# Patient Record
Sex: Male | Born: 1955 | Race: White | Hispanic: No | Marital: Married | Smoking: Never smoker
Health system: Southern US, Community
[De-identification: ages and names within clinical notes are randomized; demographics above are authoritative.]

## PROBLEM LIST (undated history)

## (undated) DIAGNOSIS — I10 Essential (primary) hypertension: Secondary | ICD-10-CM

## (undated) DIAGNOSIS — E785 Hyperlipidemia, unspecified: Secondary | ICD-10-CM

## (undated) HISTORY — PX: APPENDECTOMY: SHX54

---

## 1999-08-28 ENCOUNTER — Ambulatory Visit: Admission: RE | Admit: 1999-08-28 | Discharge: 1999-08-28 | Payer: Self-pay | Admitting: Family Medicine

## 2001-12-25 ENCOUNTER — Ambulatory Visit (HOSPITAL_COMMUNITY): Admission: RE | Admit: 2001-12-25 | Discharge: 2001-12-25 | Payer: Self-pay | Admitting: Gastroenterology

## 2011-10-02 ENCOUNTER — Emergency Department
Admission: EM | Admit: 2011-10-02 | Discharge: 2011-10-02 | Disposition: A | Discharge status: Home / Self Care | Payer: 59 | Source: Home / Self Care

## 2013-10-24 ENCOUNTER — Emergency Department
Admission: EM | Admit: 2013-10-24 | Discharge: 2013-10-24 | Disposition: A | Discharge status: Home / Self Care | Payer: 59 | Source: Home / Self Care | Attending: Emergency Medicine | Admitting: Emergency Medicine

## 2013-10-24 ENCOUNTER — Encounter: Payer: Self-pay | Admitting: Emergency Medicine

## 2013-10-24 DIAGNOSIS — L02519 Cutaneous abscess of unspecified hand: Secondary | ICD-10-CM

## 2013-10-24 DIAGNOSIS — I10 Essential (primary) hypertension: Secondary | ICD-10-CM | POA: Insufficient documentation

## 2013-10-24 DIAGNOSIS — L02512 Cutaneous abscess of left hand: Secondary | ICD-10-CM

## 2013-10-24 DIAGNOSIS — L03019 Cellulitis of unspecified finger: Secondary | ICD-10-CM

## 2013-10-24 DIAGNOSIS — E785 Hyperlipidemia, unspecified: Secondary | ICD-10-CM | POA: Insufficient documentation

## 2013-10-24 HISTORY — DX: Hyperlipidemia, unspecified: E78.5

## 2013-10-24 HISTORY — DX: Essential (primary) hypertension: I10

## 2013-10-24 MED ORDER — DOXYCYCLINE HYCLATE 100 MG PO CAPS: 100.0000 mg | ORAL_CAPSULE | Freq: Two times a day (BID) | ORAL | Status: DC

## 2013-10-24 NOTE — ED Provider Notes (Signed)
CSN: [id number]     Arrival date & time 10/24/13  1639 History   First MD Initiated Contact with Patient 10/24/13 1640     Chief Complaint  Patient presents with  . Wound Infection    x 2 days   HPI Kara complains of redness and swelling on his 2 nd finger on his left hand for 2 days. Denies fever, chills or sweats.  He does not remember any trauma or insect or animal bites. Noted slight pus drainage yesterday. Redness and swelling is worse today. Pain is 2 out of possible 10 .--Has not tried any pain medication.  No fever, chills, coryza, cough, chest pain, dyspnea, nausea, vomiting or any GI symptoms Past Medical History  Diagnosis Date  . Hyperlipidemia   . Hypertension    Past Surgical History  Procedure Laterality Date  . Appendectomy     Family History  Problem Relation Age of Onset  . Cancer Mother     colon  . Heart attack Father    History  Substance Use Topics  . Smoking status: Never Smoker   . Smokeless tobacco: Never Used  . Alcohol Use: No    Review of Systems  All other systems reviewed and are negative.    Allergies  Review of patient's allergies indicates no known allergies.  Home Medications   Current Outpatient Rx  Name  Route  Sig  Dispense  Refill  . aspirin 81 MG tablet   Oral   Take 81 mg by mouth daily.         . calcium carbonate (OS-CAL) 600 MG TABS tablet   Oral   Take 600 mg by mouth 2 (two) times daily with a meal.         . magnesium gluconate (MAGONATE) 500 MG tablet   Oral   Take 500 mg by mouth 2 (two) times daily.         . Omega-3 Fatty Acids (FISH OIL ULTRA PO)   Oral   Take by mouth.         . simvastatin (ZOCOR) 80 MG tablet   Oral   Take 80 mg by mouth daily.         . traZODone (DESYREL) 100 MG tablet   Oral   Take 100 mg by mouth at bedtime.         Marland Kitchen doxycycline (VIBRAMYCIN) 100 MG capsule   Oral   Take 1 capsule (100 mg total) by mouth 2 (two) times daily.   20 capsule   0    BP  138/90  Pulse 86  Temp(Src) 97.7 F (36.5 C) (Oral)  Ht 5\' 8"  (1.727 m)  Wt 235 lb (106.595 kg)  BMI 35.74 kg/m2  SpO2 96% Physical Exam  Nursing note and vitals reviewed. Constitutional: He is oriented to person, place, and time. He appears well-developed and well-nourished. No distress.  HENT:  Head: Normocephalic and atraumatic.  Eyes: Conjunctivae and EOM are normal. Pupils are equal, round, and reactive to light. No scleral icterus.  Neck: Normal range of motion.  Cardiovascular: Normal rate.   Pulmonary/Chest: Effort normal.  Abdominal: He exhibits no distension.  Musculoskeletal: Normal range of motion.       Left hand: He exhibits normal range of motion and normal capillary refill. Normal strength noted.       Hands: Dorsum of left second finger, 1 x 1 cm area of redness, induration, heat, tenderness, with 3 x 3 mm pustule that's fluctuant in the  center.  No bony tenderness. Function normal of left second finger and left hand. No red streaks.  Neurovascular distally intact  Neurological: He is alert and oriented to person, place, and time.  Skin: Skin is warm.  Psychiatric: He has a normal mood and affect.    ED Course  INCISION AND DRAINAGE Date/Time: 10/24/2013 5:25 PM Performed by: Georgina PillionMASSEY, DAVID Authorized by: Georgina PillionMASSEY, DAVID Consent: Verbal consent obtained. Risks and benefits: risks, benefits and alternatives were discussed Consent given by: patient Patient understanding: patient states understanding of the procedure being performed Patient identity confirmed: verbally with patient Time out: Immediately prior to procedure a "time out" was called to verify the correct patient, procedure, equipment, support staff and site/side marked as required. Type: abscess Body area: upper extremity Location details: left index finger Local anesthetic: None--patient declined. Scalpel size: 11 Incision type: single straight Complexity: simple Drainage: purulent and  serosanguinous Drainage amount: scant Wound treatment: wound left open Patient tolerance: Patient tolerated the procedure well with no immediate complications. Comments: Before procedure performed, prepped with alcohol swab. -Sterile technique.  At time of puncture I&D, tiny amount of foul-smelling purulent drainage followed by mild serosanguineous drainage. He tolerated well. Good hemostasis with direct pressure. Bandage applied. Wound care discussed. Culturette from drainage sent for wound culture.   Labs Review Labs Reviewed - No data to display Imaging Review No results found.  EKG Interpretation    Date/Time:    Ventricular Rate:    PR Interval:    QRS Duration:   QT Interval:    QTC Calculation:   R Axis:     Text Interpretation:              MDM   1. Abscess of finger, left    I&D performed as described above Wound culture sent. Doxycycline prescribed Wound care discussed. Precautions discussed. Red flags discussed. Questions invited and answered. Patient voiced understanding and agreement.     Lajean Manesavid Massey, MD 10/24/13 1850

## 2013-10-24 NOTE — ED Notes (Signed)
Jordan Oconnor complains of redness and swelling on his 2 nd finger on his left hand for 2 days. Denies fever, chills or sweats.

## 2013-10-27 ENCOUNTER — Telehealth: Payer: Self-pay | Admitting: *Deleted

## 2013-10-27 LAB — WOUND CULTURE
Gram Stain: NONE SEEN
Gram Stain: NONE SEEN
Gram Stain: NONE SEEN

## 2016-08-05 DIAGNOSIS — K219 Gastro-esophageal reflux disease without esophagitis: Secondary | ICD-10-CM | POA: Diagnosis not present

## 2016-08-05 DIAGNOSIS — E7800 Pure hypercholesterolemia, unspecified: Secondary | ICD-10-CM | POA: Diagnosis not present

## 2016-08-05 DIAGNOSIS — Z23 Encounter for immunization: Secondary | ICD-10-CM | POA: Diagnosis not present

## 2016-08-05 DIAGNOSIS — Z0000 Encounter for general adult medical examination without abnormal findings: Secondary | ICD-10-CM | POA: Diagnosis not present

## 2016-08-05 DIAGNOSIS — I10 Essential (primary) hypertension: Secondary | ICD-10-CM | POA: Diagnosis not present

## 2016-08-05 DIAGNOSIS — G479 Sleep disorder, unspecified: Secondary | ICD-10-CM | POA: Diagnosis not present

## 2016-08-06 ENCOUNTER — Other Ambulatory Visit: Payer: Self-pay | Admitting: Family Medicine

## 2016-08-06 DIAGNOSIS — R946 Abnormal results of thyroid function studies: Secondary | ICD-10-CM

## 2016-08-14 ENCOUNTER — Ambulatory Visit
Admission: RE | Admit: 2016-08-14 | Discharge: 2016-08-14 | Disposition: A | Discharge status: Home / Self Care | Payer: Self-pay | Source: Ambulatory Visit | Attending: Family Medicine | Admitting: Family Medicine

## 2016-08-14 DIAGNOSIS — R946 Abnormal results of thyroid function studies: Secondary | ICD-10-CM

## 2016-08-14 DIAGNOSIS — E041 Nontoxic single thyroid nodule: Secondary | ICD-10-CM | POA: Diagnosis not present

## 2016-08-30 ENCOUNTER — Other Ambulatory Visit: Payer: Self-pay | Admitting: Gastroenterology

## 2016-11-08 ENCOUNTER — Encounter (HOSPITAL_COMMUNITY): Payer: Self-pay

## 2016-11-11 ENCOUNTER — Ambulatory Visit (HOSPITAL_COMMUNITY)
Admission: RE | Admit: 2016-11-11 | Discharge: 2016-11-11 | Disposition: A | Discharge status: Home / Self Care | Payer: BLUE CROSS/BLUE SHIELD | Source: Ambulatory Visit | Attending: Gastroenterology | Admitting: Gastroenterology

## 2016-11-11 ENCOUNTER — Encounter (HOSPITAL_COMMUNITY)
Admission: RE | Disposition: A | Discharge status: Home / Self Care | Payer: Self-pay | Source: Ambulatory Visit | Attending: Gastroenterology

## 2016-11-11 ENCOUNTER — Encounter (HOSPITAL_COMMUNITY): Payer: Self-pay | Admitting: *Deleted

## 2016-11-11 ENCOUNTER — Ambulatory Visit (HOSPITAL_COMMUNITY): Payer: BLUE CROSS/BLUE SHIELD | Admitting: Certified Registered Nurse Anesthetist

## 2016-11-11 DIAGNOSIS — G4733 Obstructive sleep apnea (adult) (pediatric): Secondary | ICD-10-CM | POA: Diagnosis not present

## 2016-11-11 DIAGNOSIS — I10 Essential (primary) hypertension: Secondary | ICD-10-CM | POA: Diagnosis not present

## 2016-11-11 DIAGNOSIS — Z1211 Encounter for screening for malignant neoplasm of colon: Secondary | ICD-10-CM | POA: Diagnosis not present

## 2016-11-11 DIAGNOSIS — K5730 Diverticulosis of large intestine without perforation or abscess without bleeding: Secondary | ICD-10-CM | POA: Diagnosis not present

## 2016-11-11 DIAGNOSIS — E7800 Pure hypercholesterolemia, unspecified: Secondary | ICD-10-CM | POA: Insufficient documentation

## 2016-11-11 DIAGNOSIS — Z86010 Personal history of colonic polyps: Secondary | ICD-10-CM | POA: Insufficient documentation

## 2016-11-11 DIAGNOSIS — K219 Gastro-esophageal reflux disease without esophagitis: Secondary | ICD-10-CM | POA: Insufficient documentation

## 2016-11-11 DIAGNOSIS — E785 Hyperlipidemia, unspecified: Secondary | ICD-10-CM | POA: Diagnosis not present

## 2016-11-11 HISTORY — PX: COLONOSCOPY WITH PROPOFOL: SHX5780

## 2016-11-11 SURGERY — COLONOSCOPY WITH PROPOFOL
Anesthesia: Monitor Anesthesia Care

## 2016-11-11 MED ORDER — PROPOFOL 500 MG/50ML IV EMUL
INTRAVENOUS | Status: DC | PRN
  Administered 2016-11-11: 100 ug/kg/min via INTRAVENOUS

## 2016-11-11 MED ORDER — PROPOFOL 10 MG/ML IV BOLUS
INTRAVENOUS | Status: AC
  Filled 2016-11-11: qty 20

## 2016-11-11 MED ORDER — PROPOFOL 10 MG/ML IV BOLUS
INTRAVENOUS | Status: AC
  Filled 2016-11-11: qty 40

## 2016-11-11 MED ORDER — SODIUM CHLORIDE 0.9 % IV SOLN: INTRAVENOUS | Status: DC

## 2016-11-11 MED ORDER — LACTATED RINGERS IV SOLN
INTRAVENOUS | Status: DC
  Administered 2016-11-11: 1000 mL via INTRAVENOUS

## 2016-11-11 MED ORDER — LIDOCAINE 2% (20 MG/ML) 5 ML SYRINGE
INTRAMUSCULAR | Status: DC | PRN
  Administered 2016-11-11: 100 mg via INTRAVENOUS

## 2016-11-11 MED ORDER — LIDOCAINE 2% (20 MG/ML) 5 ML SYRINGE
INTRAMUSCULAR | Status: AC
  Filled 2016-11-11: qty 5

## 2016-11-11 MED ORDER — PROPOFOL 10 MG/ML IV BOLUS
INTRAVENOUS | Status: DC | PRN
  Administered 2016-11-11 (×3): 20 mg via INTRAVENOUS
  Administered 2016-11-11: 50 mg via INTRAVENOUS

## 2016-11-11 SURGICAL SUPPLY — 21 items

## 2016-11-11 NOTE — Anesthesia Preprocedure Evaluation (Addendum)
Anesthesia Evaluation  Patient identified by MRN, date of birth, ID band Patient awake    Reviewed: Allergy & Precautions, NPO status   Airway Mallampati: II  TM Distance: >3 FB Neck ROM: Full    Dental  (+) Teeth Intact   Pulmonary neg pulmonary ROS,    breath sounds clear to auscultation       Cardiovascular hypertension,  Rhythm:Regular Rate:Normal     Neuro/Psych negative neurological ROS     GI/Hepatic negative GI ROS, Neg liver ROS,   Endo/Other  negative endocrine ROS  Renal/GU negative Renal ROS     Musculoskeletal negative musculoskeletal ROS (+)   Abdominal   Peds  Hematology negative hematology ROS (+)   Anesthesia Other Findings   Reproductive/Obstetrics                            Anesthesia Physical Anesthesia Plan  ASA: II  Anesthesia Plan: MAC   Post-op Pain Management:    Induction: Intravenous  Airway Management Planned: Natural Airway and Nasal Cannula  Additional Equipment:   Intra-op Plan:   Post-operative Plan:   Informed Consent: I have reviewed the patients History and Physical, chart, labs and discussed the procedure including the risks, benefits and alternatives for the proposed anesthesia with the patient or authorized representative who has indicated his/her understanding and acceptance.   Dental advisory given  Plan Discussed with:   Anesthesia Plan Comments:         Anesthesia Quick Evaluation

## 2016-11-11 NOTE — H&P (Signed)
Procedure: Surveillance colonoscopy. 01/29/2011 colonoscopy was performed with removal of a 4 mm descending colon tubular adenomatous polyp  History: The patient is a 61 year old male born 07/27/1956. He is scheduled to undergo a surveillance colonoscopy today.  Past medical history: Appendectomy. Hypertension. Obstructive sleep apnea. Gastroesophageal reflux. Hypercholesterolemia.  Exam: The patient is alert and lying comfortably on the endoscopy stretcher. Abdomen is soft and nontender to palpation. Lungs are clear to auscultation. Cardiac exam reveals a regular rhythm.  Plan: Proceed with surveillance colonoscopy

## 2016-11-11 NOTE — Anesthesia Postprocedure Evaluation (Addendum)
Anesthesia Post Note  Patient: Jordan Oconnor  Procedure(s) Performed: Procedure(s) (LRB): COLONOSCOPY WITH PROPOFOL (N/A)  Patient location during evaluation: Endoscopy Anesthesia Type: MAC Level of consciousness: awake and alert Pain management: pain level controlled Vital Signs Assessment: post-procedure vital signs reviewed and stable Respiratory status: spontaneous breathing, nonlabored ventilation, respiratory function stable and patient connected to nasal cannula oxygen Cardiovascular status: stable and blood pressure returned to baseline Anesthetic complications: no       Last Vitals:  Vitals:   11/11/16 1013 11/11/16 1150  BP: (!) 154/98 124/71  Pulse: 81 89  Resp: 14 15  Temp: 36.7 C 36.5 C    Last Pain:  Vitals:   11/11/16 1150  TempSrc: Oral                 Ladarrion Telfair,JAMES TERRILL

## 2016-11-11 NOTE — Op Note (Signed)
[facility] Patient Name: Jordan Oconnor Feely Procedure Date: 11/11/2016 MRN: [id number] Attending MD: Charolett BumpersMartin K Lissandro Dilorenzo , MD Date of Birth: 08/23/1956 CSN: [id number] Age: 61 Admit Type: Outpatient Procedure:                Colonoscopy Indications:              High risk colon cancer surveillance: Personal                            history of adenoma less than 10 mm in size Providers:                Charolett BumpersMartin K. Marciana Uplinger, MD, Omelia BlackwaterShelby Carpenter RN, RN,                            Darletta MollJackie Aiken Tech, Technician, Randon Goldsmithachel Jones, CRNA Referring MD:              Medicines:                Propofol per Anesthesia Complications:            No immediate complications. Estimated Blood Loss:     Estimated blood loss: none. Procedure:                Pre-Anesthesia Assessment:                           - Prior to the procedure, a History and Physical                            was performed, and patient medications and                            allergies were reviewed. The patient's tolerance of                            previous anesthesia was also reviewed. The risks                            and benefits of the procedure and the sedation                            options and risks were discussed with the patient.                            All questions were answered, and informed consent                            was obtained. Prior Anticoagulants: The patient has                            taken aspirin, last dose was day of procedure. ASA                            Grade Assessment: II - A patient with mild systemic  disease. After reviewing the risks and benefits,                            the patient was deemed in satisfactory condition to                            undergo the procedure.                           After obtaining informed consent, the colonoscope                            was passed under direct vision. Throughout the        procedure, the patient's blood pressure, pulse, and                            oxygen saturations were monitored continuously. The                            EC-[id number] ([id number]) scope was introduced through                            the anus and advanced to the the cecum, identified                            by appendiceal orifice and ileocecal valve. The                            colonoscopy was performed without difficulty. The                            patient tolerated the procedure well. The quality                            of the bowel preparation was good. The appendiceal                            orifice and the rectum were photographed. Scope In: 11:31:24 AM Scope Out: 11:43:52 AM Scope Withdrawal Time: 0 hours 8 minutes 2 seconds  Total Procedure Duration: 0 hours 12 minutes 28 seconds  Findings:      The perianal and digital rectal examinations were normal.      The entire examined colon appeared normal. Left colonic diverticulosis       was present. Impression:               - The entire examined colon is normal.                           - No specimens collected. Moderate Sedation:      N/A- Per Anesthesia Care Recommendation:           - Patient has a contact number available for                            emergencies. The signs and  symptoms of potential                            delayed complications were discussed with the                            patient. Return to normal activities tomorrow.                            Written discharge instructions were provided to the                            patient.                           - Repeat colonoscopy in 5 years for surveillance.                           - Resume previous diet.                           - Continue present medications. Procedure Code(s):        --- Professional ---                           Z6109, Colorectal cancer screening; colonoscopy on                            individual at high  risk Diagnosis Code(s):        --- Professional ---                           Z86.010, Personal history of colonic polyps CPT copyright 2016 [employer]. All rights reserved. The codes documented in this report are preliminary and upon coder review may  be revised to meet current compliance requirements. Danise Edge, MD Charolett Bumpers, MD 11/11/2016 11:51:28 AM This report has been signed electronically. Number of Addenda: 0

## 2016-11-11 NOTE — Discharge Instructions (Signed)

## 2016-11-11 NOTE — Transfer of Care (Signed)
Immediate Anesthesia Transfer of Care Note  Patient: Jordan Oconnor  Procedure(s) Performed: Procedure(s): COLONOSCOPY WITH PROPOFOL (N/A)  Patient Location: PACU  Anesthesia Type:MAC  Level of Consciousness: Patient easily awoken, sedated, comfortable, cooperative, following commands, responds to stimulation.   Airway & Oxygen Therapy: Patient spontaneously breathing, ventilating well, oxygen via simple oxygen mask.  Post-op Assessment: Report given to PACU RN, vital signs reviewed and stable, moving all extremities.   Post vital signs: Reviewed and stable.  Complications: No apparent anesthesia complications  Last Vitals:  Vitals:   11/11/16 1013 11/11/16 1150  BP: (!) 154/98 124/71  Pulse: 81 89  Resp: 14 15  Temp: 36.7 C 36.5 C    Last Pain:  Vitals:   11/11/16 1150  TempSrc: Oral         Complications: No apparent anesthesia complications

## 2016-11-12 ENCOUNTER — Encounter (HOSPITAL_COMMUNITY): Payer: Self-pay | Admitting: Gastroenterology

## 2017-02-03 DIAGNOSIS — G4733 Obstructive sleep apnea (adult) (pediatric): Secondary | ICD-10-CM | POA: Diagnosis not present

## 2017-03-11 DIAGNOSIS — L570 Actinic keratosis: Secondary | ICD-10-CM | POA: Diagnosis not present

## 2017-03-11 DIAGNOSIS — L910 Hypertrophic scar: Secondary | ICD-10-CM | POA: Diagnosis not present

## 2017-03-14 NOTE — Addendum Note (Signed)
Addendum  created 03/14/17 1012 by Hatice Bubel, Liborio, MD   Sign clinical note    

## 2017-05-15 DIAGNOSIS — G4733 Obstructive sleep apnea (adult) (pediatric): Secondary | ICD-10-CM | POA: Diagnosis not present

## 2017-08-29 DIAGNOSIS — G4733 Obstructive sleep apnea (adult) (pediatric): Secondary | ICD-10-CM | POA: Diagnosis not present

## 2017-09-08 DIAGNOSIS — E7800 Pure hypercholesterolemia, unspecified: Secondary | ICD-10-CM | POA: Diagnosis not present

## 2017-09-08 DIAGNOSIS — I10 Essential (primary) hypertension: Secondary | ICD-10-CM | POA: Diagnosis not present

## 2017-09-08 DIAGNOSIS — Z23 Encounter for immunization: Secondary | ICD-10-CM | POA: Diagnosis not present

## 2017-09-08 DIAGNOSIS — Z0000 Encounter for general adult medical examination without abnormal findings: Secondary | ICD-10-CM | POA: Diagnosis not present

## 2017-09-08 DIAGNOSIS — G479 Sleep disorder, unspecified: Secondary | ICD-10-CM | POA: Diagnosis not present

## 2017-09-08 DIAGNOSIS — E041 Nontoxic single thyroid nodule: Secondary | ICD-10-CM | POA: Diagnosis not present

## 2017-09-12 ENCOUNTER — Other Ambulatory Visit: Payer: Self-pay | Admitting: Family Medicine

## 2017-09-12 DIAGNOSIS — E041 Nontoxic single thyroid nodule: Secondary | ICD-10-CM

## 2017-09-19 ENCOUNTER — Ambulatory Visit
Admission: RE | Admit: 2017-09-19 | Discharge: 2017-09-19 | Disposition: A | Discharge status: Home / Self Care | Payer: BLUE CROSS/BLUE SHIELD | Source: Ambulatory Visit | Attending: Family Medicine | Admitting: Family Medicine

## 2017-09-19 DIAGNOSIS — E041 Nontoxic single thyroid nodule: Secondary | ICD-10-CM

## 2017-09-19 DIAGNOSIS — E042 Nontoxic multinodular goiter: Secondary | ICD-10-CM | POA: Diagnosis not present

## 2017-09-24 ENCOUNTER — Other Ambulatory Visit: Payer: Self-pay | Admitting: Family Medicine

## 2017-09-24 DIAGNOSIS — E041 Nontoxic single thyroid nodule: Secondary | ICD-10-CM

## 2017-09-30 ENCOUNTER — Other Ambulatory Visit (HOSPITAL_COMMUNITY)
Admission: RE | Admit: 2017-09-30 | Discharge: 2017-09-30 | Disposition: A | Discharge status: Home / Self Care | Payer: BLUE CROSS/BLUE SHIELD | Source: Ambulatory Visit | Attending: Radiology | Admitting: Radiology

## 2017-09-30 ENCOUNTER — Ambulatory Visit
Admission: RE | Admit: 2017-09-30 | Discharge: 2017-09-30 | Disposition: A | Discharge status: Home / Self Care | Payer: BLUE CROSS/BLUE SHIELD | Source: Ambulatory Visit | Attending: Family Medicine | Admitting: Family Medicine

## 2017-09-30 DIAGNOSIS — E041 Nontoxic single thyroid nodule: Secondary | ICD-10-CM | POA: Insufficient documentation

## 2017-11-18 DIAGNOSIS — D225 Melanocytic nevi of trunk: Secondary | ICD-10-CM | POA: Diagnosis not present

## 2017-11-18 DIAGNOSIS — L910 Hypertrophic scar: Secondary | ICD-10-CM | POA: Diagnosis not present

## 2017-11-18 DIAGNOSIS — L821 Other seborrheic keratosis: Secondary | ICD-10-CM | POA: Diagnosis not present

## 2017-11-18 DIAGNOSIS — L814 Other melanin hyperpigmentation: Secondary | ICD-10-CM | POA: Diagnosis not present

## 2017-11-18 DIAGNOSIS — D1801 Hemangioma of skin and subcutaneous tissue: Secondary | ICD-10-CM | POA: Diagnosis not present

## 2018-05-17 IMAGING — US US THYROID
1 series · 13 of 25 positions shown · non-contrast
Comparison: None.

CLINICAL DATA: PALPABLE THYROID NODULE ON EXAM

EXAM:
THYROID ULTRASOUND
TECHNIQUE: Ultrasound examination of the thyroid gland and adjacent soft
tissues was performed.

[Series 1: us thyroid · 0.08mm/px · 13 of 45 slices shown]
[im 1/45]
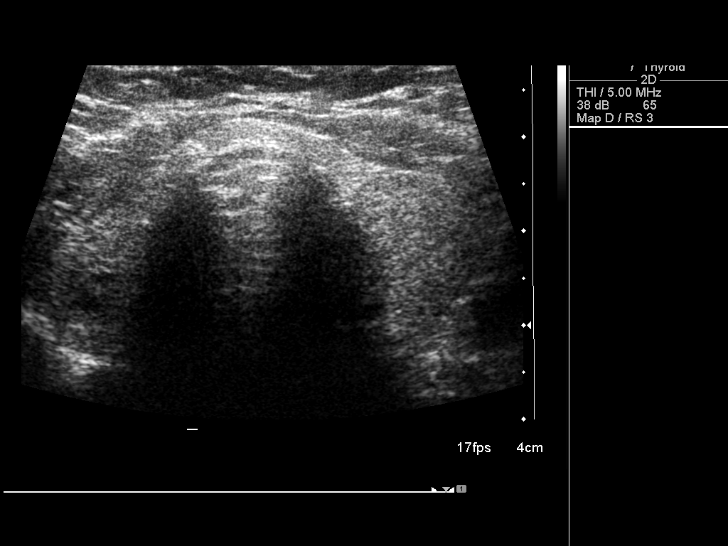
[im 4/45]
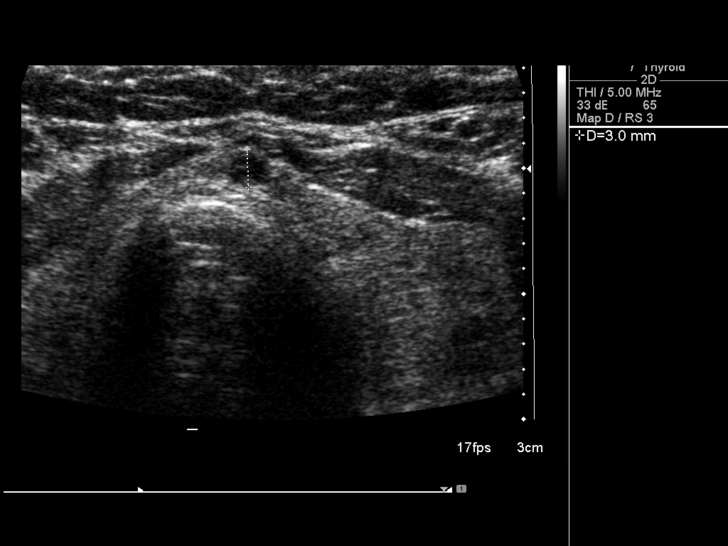
[im 8/45]
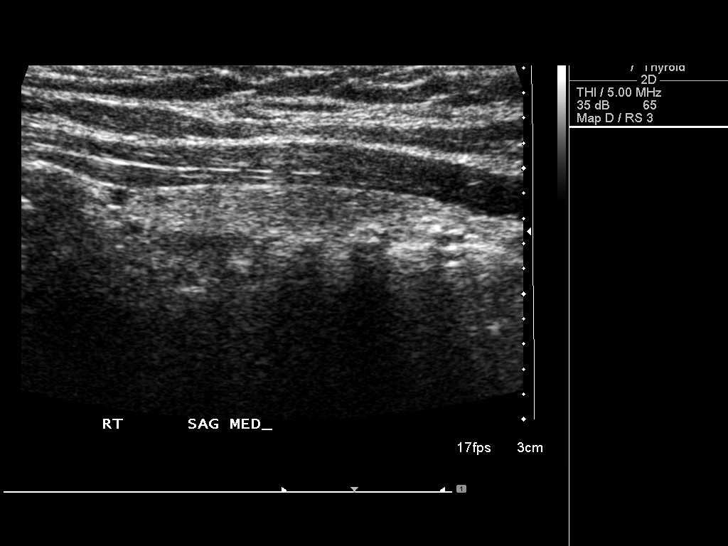
[im 12/45]
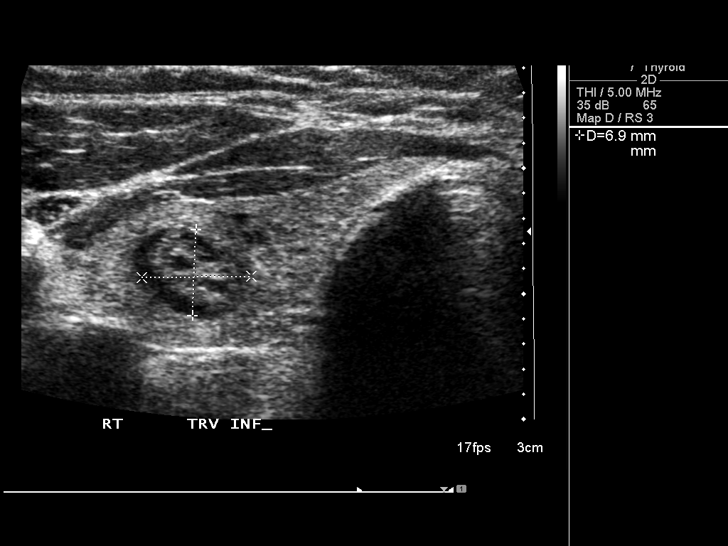
[im 15/45]
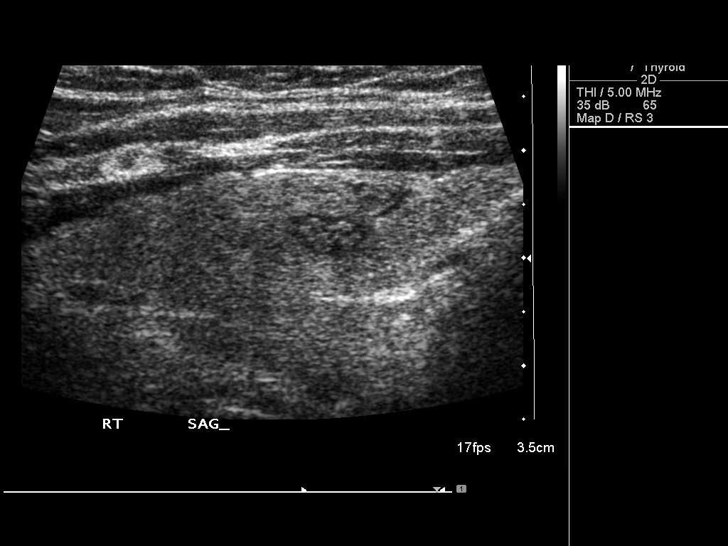
[im 19/45]
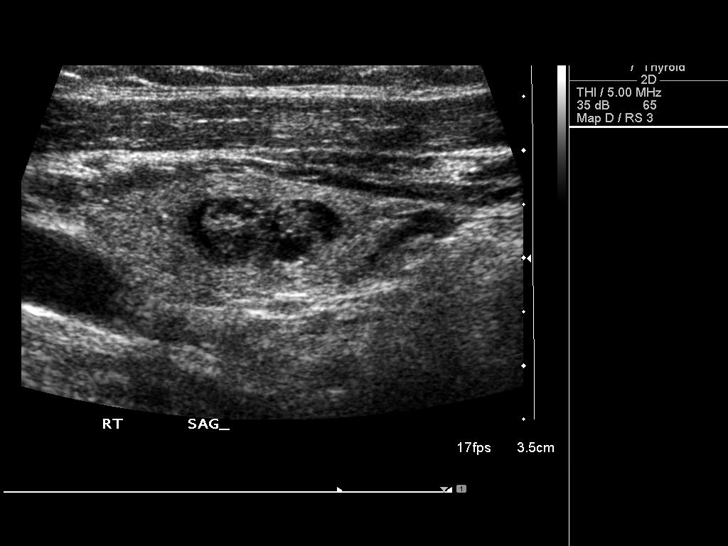
[im 23/45]
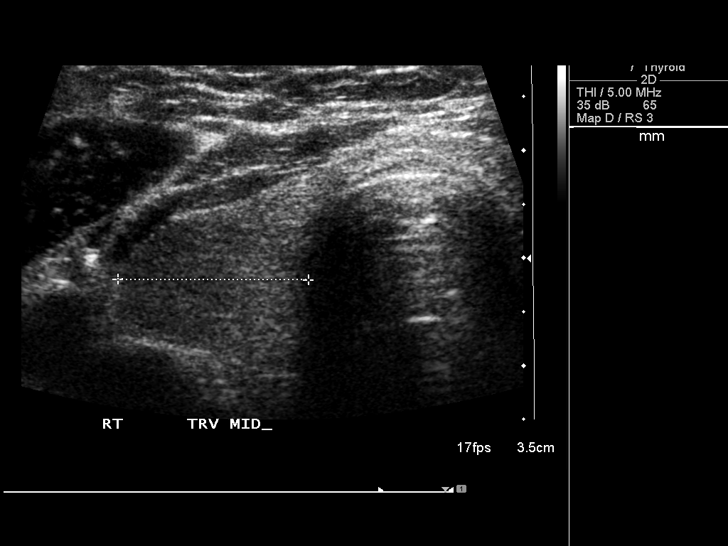
[im 26/45]
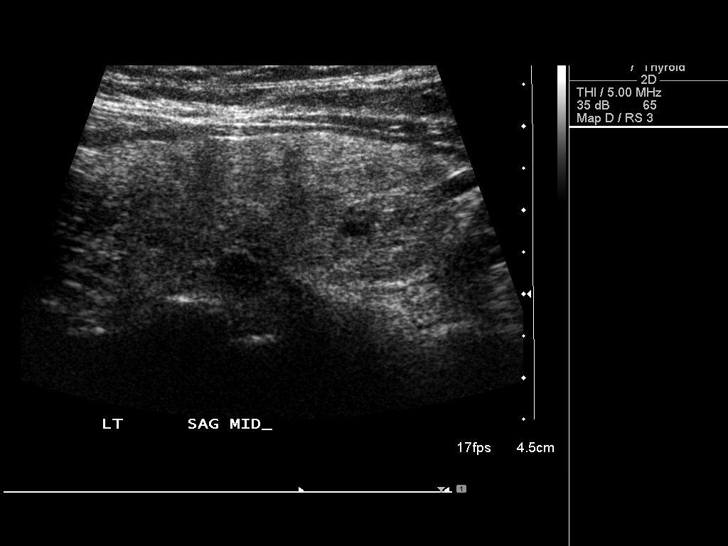
[im 30/45]
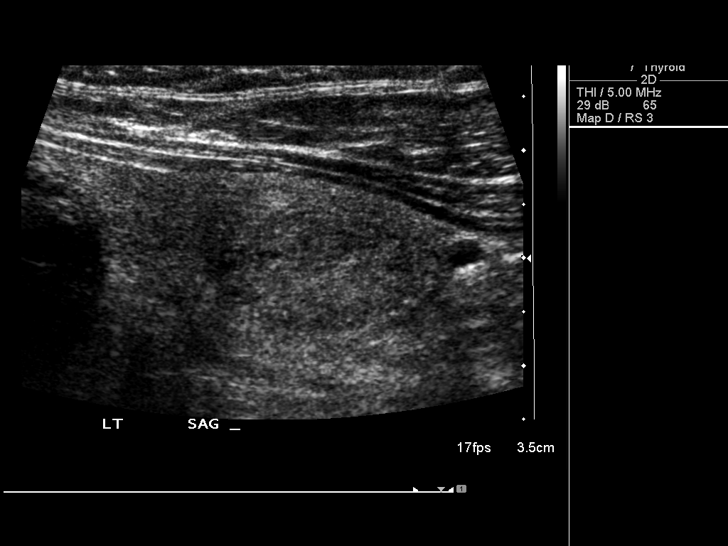
[im 34/45]
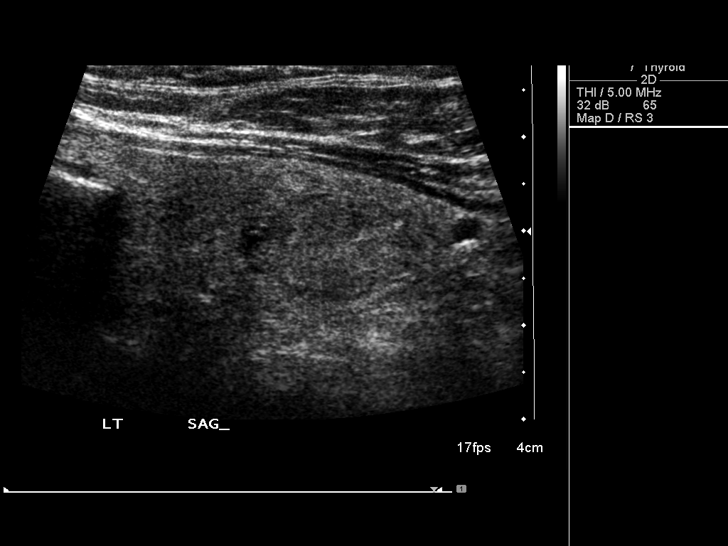
[im 37/45]
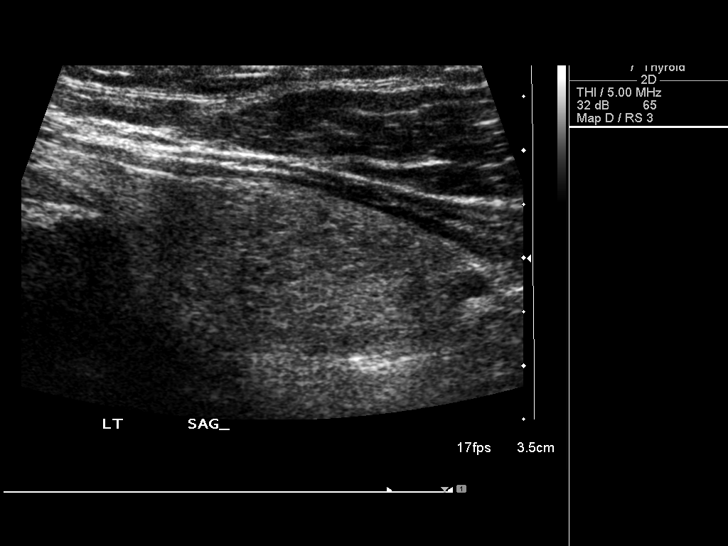
[im 41/45]
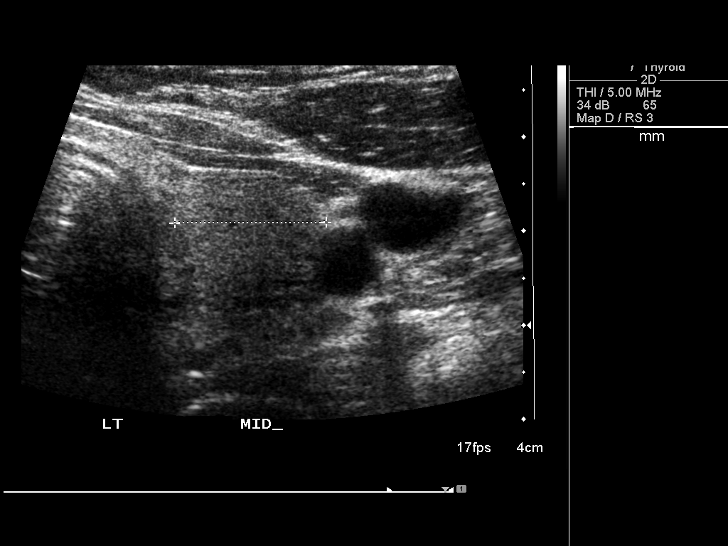
[im 45/45]
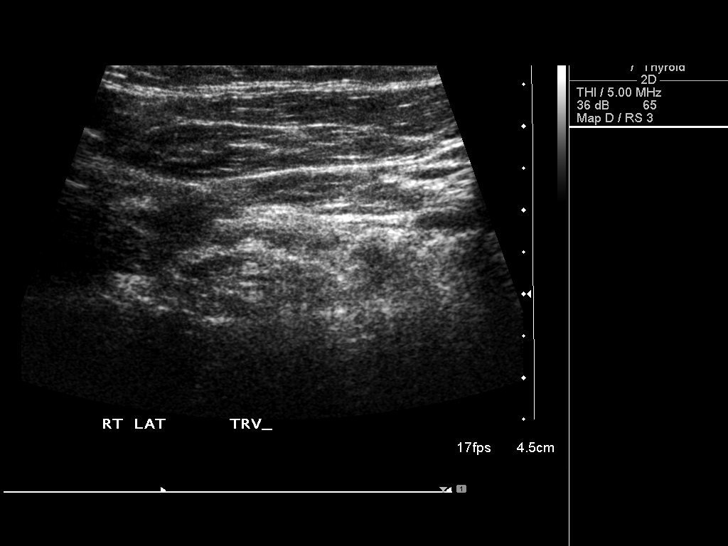

[13 of 25 positions shown; findings below may reference images not displayed]

FINDINGS: Parenchymal Echotexture: Mildly heterogenous

Estimated total number of nodules >/= 1 cm: 2

Number of spongiform nodules >/=  2 cm not described below (TR1): 0

Number of mixed cystic and solid nodules >/= 1.5 cm not described
below (TR2): 0

_________________________________________________________

Isthmus: 3 mm

Incidental tiny 3 mm cystic nodule in the isthmus noted.

_________________________________________________________

Right lobe: 4.5 x 1.5 x 1.8 cm

Nodule # 1:

Location: Right; Inferior

Size: 10 x 7 x 9 mm

Composition: mixed cystic and solid (1)

Echogenicity: isoechoic (1)

Shape: not taller-than-wide (0)

Margins: ill-defined (0)

Echogenic foci: none (0)

ACR TI-RADS total points: 2.

ACR TI-RADS risk category: TR2 (2 points).

ACR TI-RADS recommendations:

This nodule does NOT meet TI-RADS criteria for biopsy or dedicated
follow-up.

Nodule # 2:

Location: Right; Inferior

Size: 8 x 5 x 7 mm

Composition: mixed cystic and solid (1)

Echogenicity: isoechoic (1)

Shape: not taller-than-wide (0)

Margins: ill-defined (0)

Echogenic foci: none (0)

ACR TI-RADS total points: 2.

ACR TI-RADS risk category: TR2 (2 points).

ACR TI-RADS recommendations:

This nodule does NOT meet TI-RADS criteria for biopsy or dedicated
follow-up.

_________________________________________________________

Left lobe: 4.5 x 1.8 x 1.6 cm

Nodule # 3:

Location: Left; Inferior

Size: 1.6 x 1.1 x 1.6 cm

Composition: solid/almost completely solid (2)

Echogenicity: isoechoic (1)

Shape: not taller-than-wide (0)

Margins: ill-defined (0)

Echogenic foci: none (0)

ACR TI-RADS total points: 3.

ACR TI-RADS risk category: TR3 (3 points).

ACR TI-RADS recommendations:

*Given size (>/= 1.5 - 2.4 cm) and appearance, a follow-up
ultrasound in 1 year should be considered based on TI-RADS criteria.
IMPRESSION: 1.6 cm left inferior isoechoic solid nodule (TR 3) meets criteria
for follow-up in 1 year.

Right inferior pole mixed cystic solid nodules (TR 2) do not meet
criteria for biopsy or additional follow-up.

The above is in keeping with the ACR TI-RADS recommendations - [HOSPITAL] 1732;[DATE].

## 2018-06-01 DIAGNOSIS — L0390 Cellulitis, unspecified: Secondary | ICD-10-CM | POA: Diagnosis not present

## 2018-06-01 DIAGNOSIS — Z6837 Body mass index (BMI) 37.0-37.9, adult: Secondary | ICD-10-CM | POA: Diagnosis not present

## 2018-08-05 DIAGNOSIS — G4733 Obstructive sleep apnea (adult) (pediatric): Secondary | ICD-10-CM | POA: Diagnosis not present

## 2018-08-14 HISTORY — PX: NEPHRECTOMY: SHX65

## 2018-08-18 DIAGNOSIS — Z7982 Long term (current) use of aspirin: Secondary | ICD-10-CM | POA: Diagnosis not present

## 2018-08-18 DIAGNOSIS — N3289 Other specified disorders of bladder: Secondary | ICD-10-CM | POA: Diagnosis not present

## 2018-08-18 DIAGNOSIS — K8689 Other specified diseases of pancreas: Secondary | ICD-10-CM | POA: Diagnosis not present

## 2018-08-18 DIAGNOSIS — I10 Essential (primary) hypertension: Secondary | ICD-10-CM | POA: Diagnosis not present

## 2018-08-18 DIAGNOSIS — N2889 Other specified disorders of kidney and ureter: Secondary | ICD-10-CM | POA: Diagnosis not present

## 2018-08-18 DIAGNOSIS — R1084 Generalized abdominal pain: Secondary | ICD-10-CM | POA: Diagnosis not present

## 2018-08-18 DIAGNOSIS — R160 Hepatomegaly, not elsewhere classified: Secondary | ICD-10-CM | POA: Diagnosis not present

## 2018-08-18 DIAGNOSIS — R319 Hematuria, unspecified: Secondary | ICD-10-CM | POA: Diagnosis not present

## 2018-08-18 DIAGNOSIS — Z79899 Other long term (current) drug therapy: Secondary | ICD-10-CM | POA: Diagnosis not present

## 2018-08-20 DIAGNOSIS — N23 Unspecified renal colic: Secondary | ICD-10-CM | POA: Diagnosis not present

## 2018-08-20 DIAGNOSIS — C649 Malignant neoplasm of unspecified kidney, except renal pelvis: Secondary | ICD-10-CM | POA: Diagnosis not present

## 2018-08-21 DIAGNOSIS — N200 Calculus of kidney: Secondary | ICD-10-CM | POA: Diagnosis not present

## 2018-08-21 DIAGNOSIS — R319 Hematuria, unspecified: Secondary | ICD-10-CM | POA: Diagnosis not present

## 2018-08-21 DIAGNOSIS — K4090 Unilateral inguinal hernia, without obstruction or gangrene, not specified as recurrent: Secondary | ICD-10-CM | POA: Diagnosis not present

## 2018-08-21 DIAGNOSIS — R160 Hepatomegaly, not elsewhere classified: Secondary | ICD-10-CM | POA: Diagnosis not present

## 2018-08-21 DIAGNOSIS — K5730 Diverticulosis of large intestine without perforation or abscess without bleeding: Secondary | ICD-10-CM | POA: Diagnosis not present

## 2018-08-21 DIAGNOSIS — N2889 Other specified disorders of kidney and ureter: Secondary | ICD-10-CM | POA: Diagnosis not present

## 2018-08-26 DIAGNOSIS — I10 Essential (primary) hypertension: Secondary | ICD-10-CM | POA: Diagnosis not present

## 2018-08-26 DIAGNOSIS — E785 Hyperlipidemia, unspecified: Secondary | ICD-10-CM | POA: Diagnosis not present

## 2018-08-26 DIAGNOSIS — N2889 Other specified disorders of kidney and ureter: Secondary | ICD-10-CM | POA: Diagnosis not present

## 2018-08-26 DIAGNOSIS — Z01818 Encounter for other preprocedural examination: Secondary | ICD-10-CM | POA: Diagnosis not present

## 2018-08-26 DIAGNOSIS — G4733 Obstructive sleep apnea (adult) (pediatric): Secondary | ICD-10-CM | POA: Diagnosis not present

## 2018-08-28 DIAGNOSIS — I10 Essential (primary) hypertension: Secondary | ICD-10-CM | POA: Diagnosis not present

## 2018-08-28 DIAGNOSIS — Z6834 Body mass index (BMI) 34.0-34.9, adult: Secondary | ICD-10-CM | POA: Diagnosis not present

## 2018-08-28 DIAGNOSIS — N2889 Other specified disorders of kidney and ureter: Secondary | ICD-10-CM | POA: Diagnosis not present

## 2018-08-28 DIAGNOSIS — E785 Hyperlipidemia, unspecified: Secondary | ICD-10-CM | POA: Diagnosis not present

## 2018-08-28 DIAGNOSIS — K567 Ileus, unspecified: Secondary | ICD-10-CM | POA: Diagnosis not present

## 2018-08-28 DIAGNOSIS — E669 Obesity, unspecified: Secondary | ICD-10-CM | POA: Diagnosis not present

## 2018-08-28 DIAGNOSIS — R109 Unspecified abdominal pain: Secondary | ICD-10-CM | POA: Diagnosis not present

## 2018-08-28 DIAGNOSIS — C641 Malignant neoplasm of right kidney, except renal pelvis: Secondary | ICD-10-CM | POA: Diagnosis not present

## 2018-08-28 DIAGNOSIS — K219 Gastro-esophageal reflux disease without esophagitis: Secondary | ICD-10-CM | POA: Diagnosis not present

## 2018-08-28 DIAGNOSIS — Z9989 Dependence on other enabling machines and devices: Secondary | ICD-10-CM | POA: Diagnosis not present

## 2018-08-28 DIAGNOSIS — G4733 Obstructive sleep apnea (adult) (pediatric): Secondary | ICD-10-CM | POA: Diagnosis not present

## 2018-09-01 DIAGNOSIS — R109 Unspecified abdominal pain: Secondary | ICD-10-CM | POA: Diagnosis not present

## 2018-09-02 DIAGNOSIS — G4733 Obstructive sleep apnea (adult) (pediatric): Secondary | ICD-10-CM | POA: Diagnosis not present

## 2018-09-08 DIAGNOSIS — C641 Malignant neoplasm of right kidney, except renal pelvis: Secondary | ICD-10-CM | POA: Diagnosis not present

## 2018-09-08 DIAGNOSIS — Z905 Acquired absence of kidney: Secondary | ICD-10-CM | POA: Diagnosis not present

## 2018-09-15 DIAGNOSIS — I10 Essential (primary) hypertension: Secondary | ICD-10-CM | POA: Diagnosis not present

## 2018-09-28 DIAGNOSIS — C649 Malignant neoplasm of unspecified kidney, except renal pelvis: Secondary | ICD-10-CM | POA: Diagnosis not present

## 2018-09-28 DIAGNOSIS — R948 Abnormal results of function studies of other organs and systems: Secondary | ICD-10-CM | POA: Diagnosis not present

## 2018-09-28 DIAGNOSIS — M170 Bilateral primary osteoarthritis of knee: Secondary | ICD-10-CM | POA: Diagnosis not present

## 2018-09-28 DIAGNOSIS — R918 Other nonspecific abnormal finding of lung field: Secondary | ICD-10-CM | POA: Diagnosis not present

## 2018-09-28 DIAGNOSIS — C641 Malignant neoplasm of right kidney, except renal pelvis: Secondary | ICD-10-CM | POA: Diagnosis not present

## 2018-09-28 DIAGNOSIS — J984 Other disorders of lung: Secondary | ICD-10-CM | POA: Diagnosis not present

## 2018-10-02 DIAGNOSIS — C641 Malignant neoplasm of right kidney, except renal pelvis: Secondary | ICD-10-CM | POA: Diagnosis not present

## 2018-10-14 IMAGING — US US THYROID
1 series · 13 of 25 positions shown · non-contrast
Comparison: 08/14/2016

CLINICAL DATA: 69-year-old male with a history of thyroid nodules

EXAM:
THYROID ULTRASOUND
TECHNIQUE: Ultrasound examination of the thyroid gland and adjacent soft
tissues was performed.

[Series 1: us thyroid · 0.04mm/px · 13 of 78 slices shown]
[im 1/78]
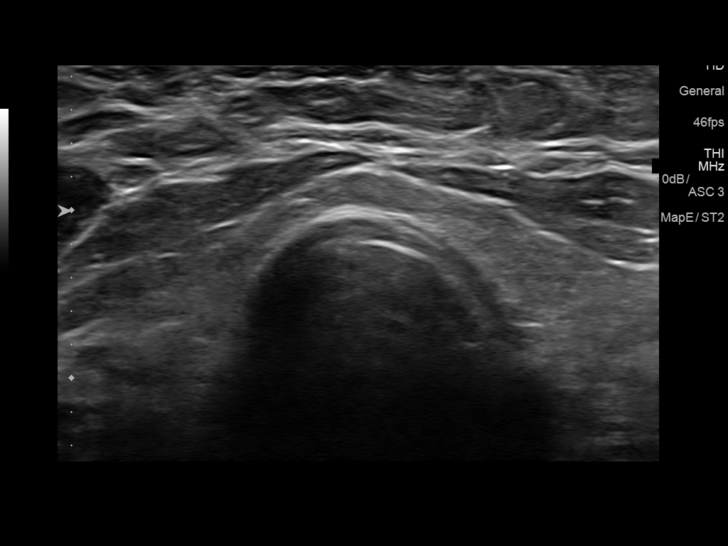
[im 7/78]
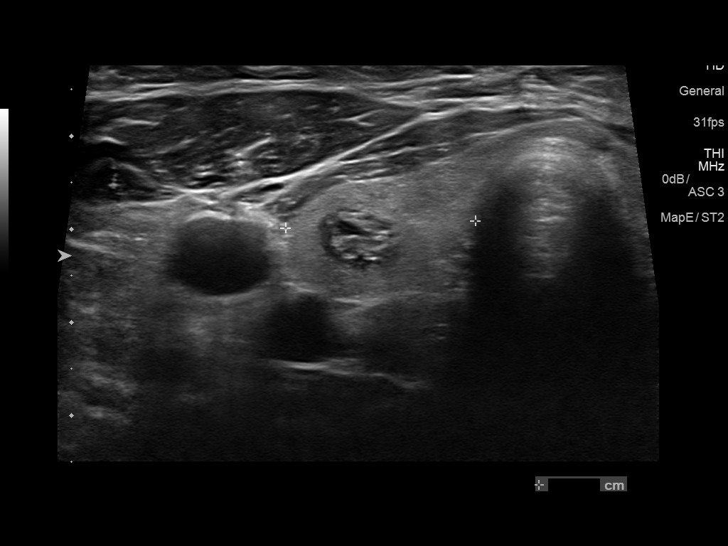
[im 13/78]
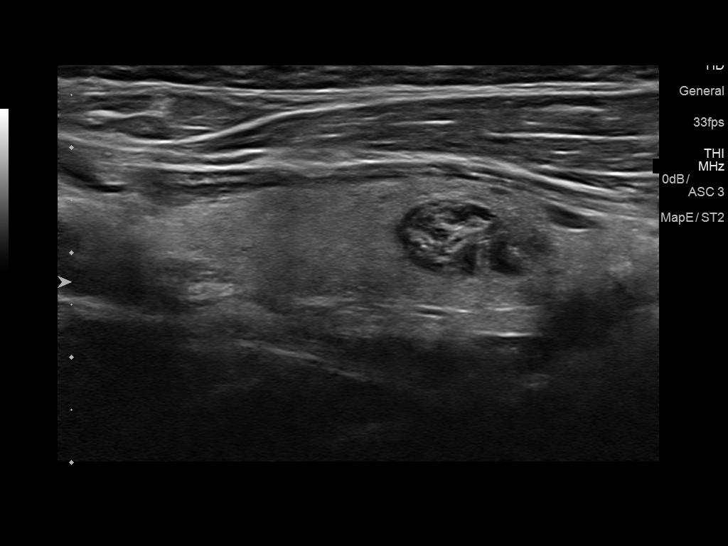
[im 20/78]
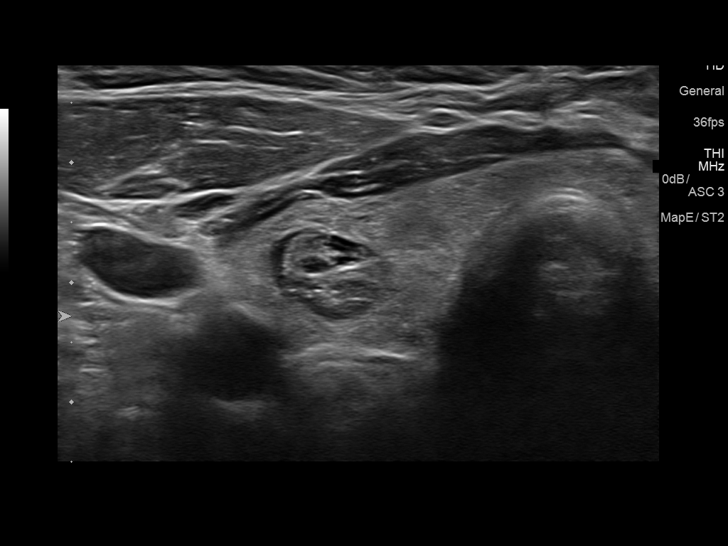
[im 26/78]
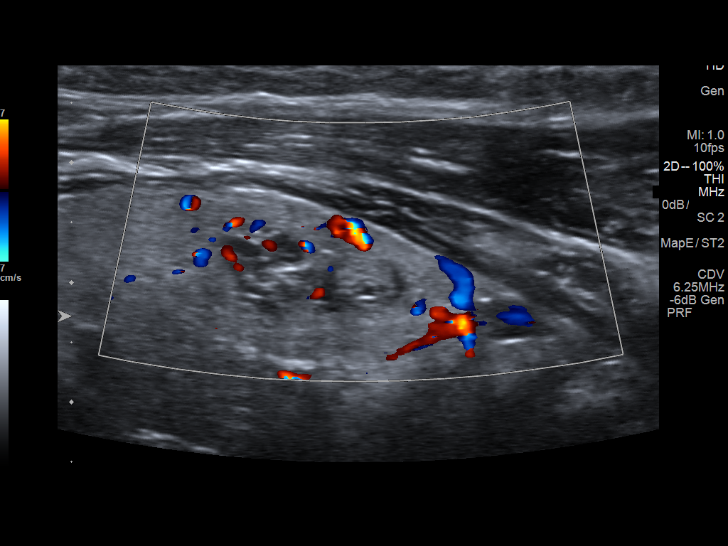
[im 33/78]
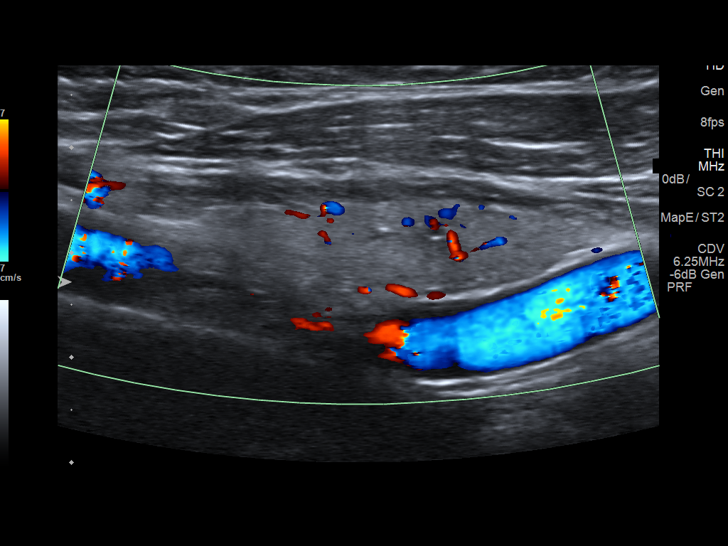
[im 39/78]
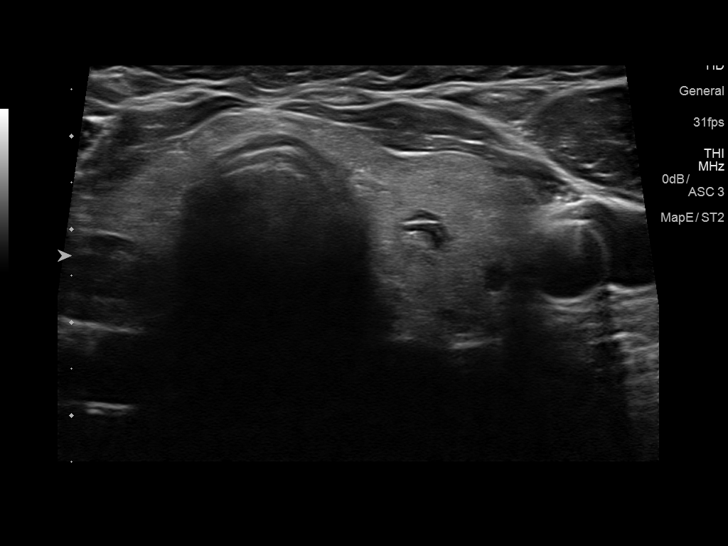
[im 45/78]
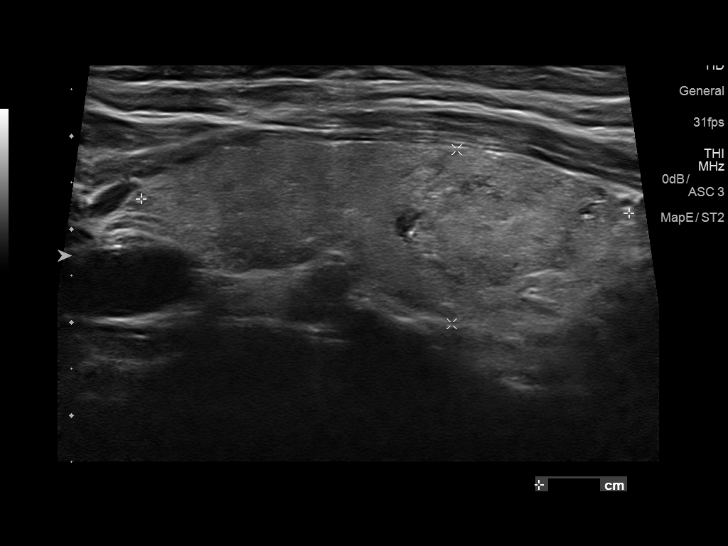
[im 52/78]
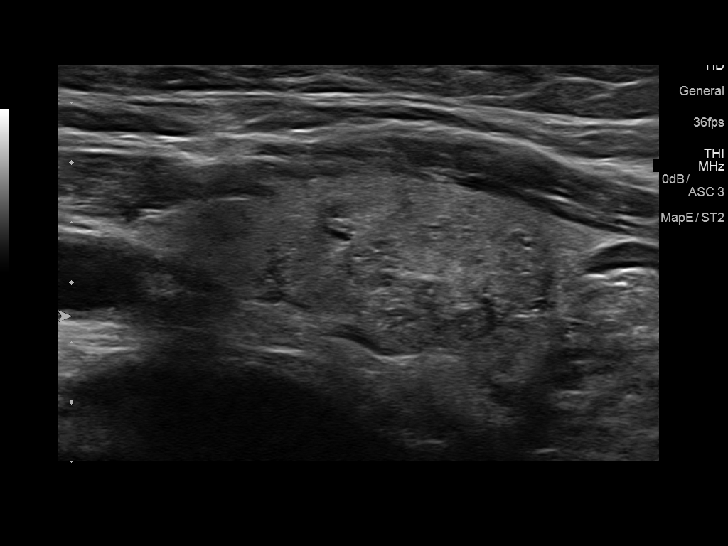
[im 58/78]
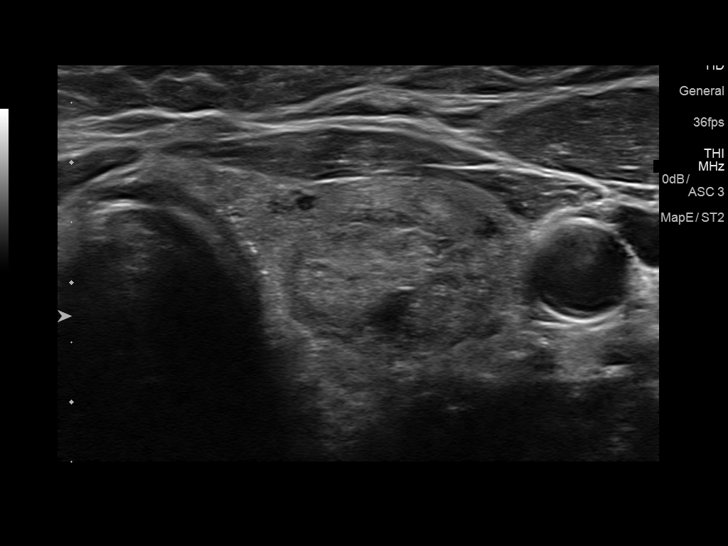
[im 65/78]
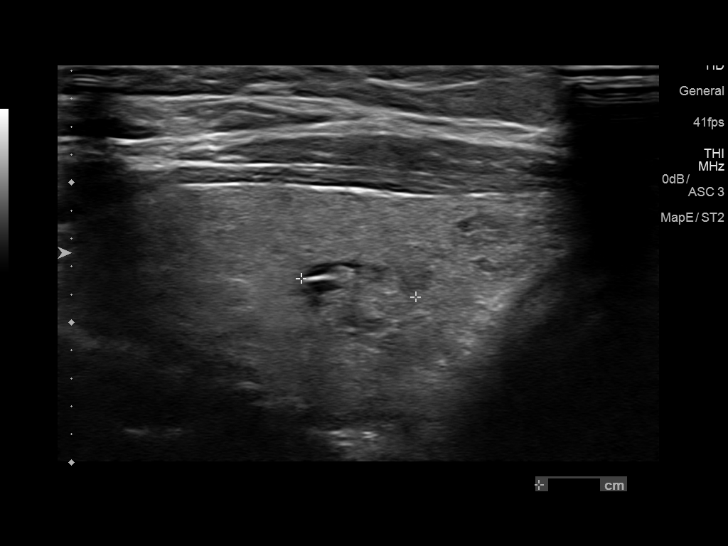
[im 71/78]
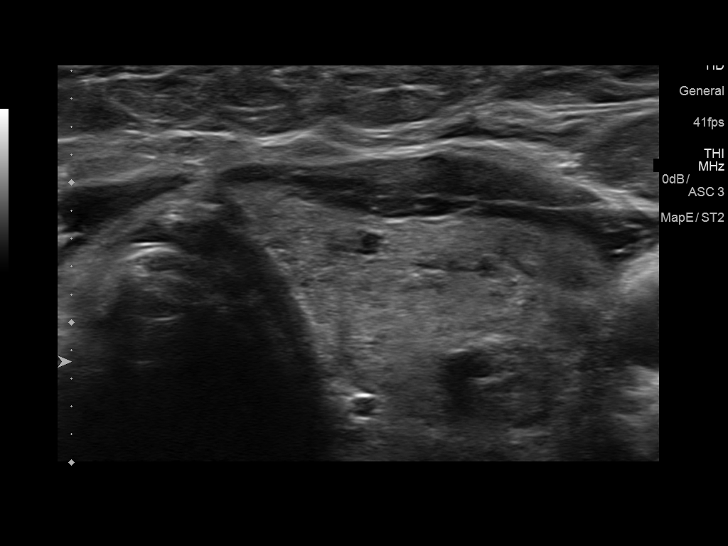
[im 78/78]
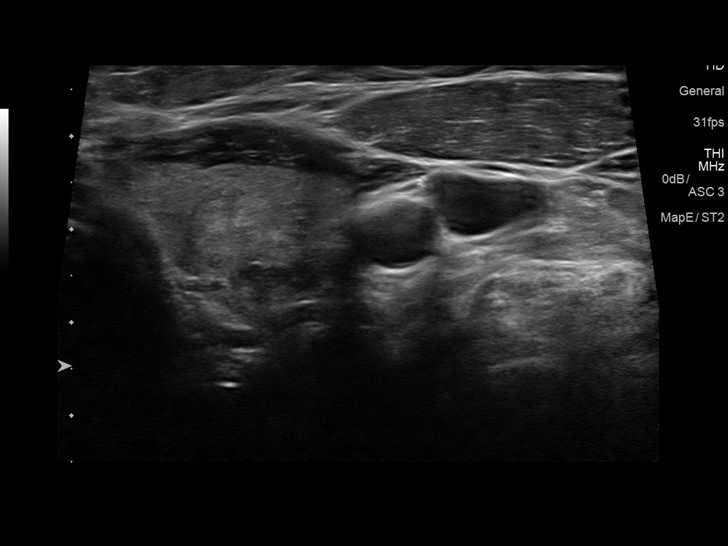

[13 of 25 positions shown; findings below may reference images not displayed]

FINDINGS: Parenchymal Echotexture: Normal

Isthmus: 0.3 cm

Right lobe: 4.8 cm x 1.3 cm x 2.0 cm

Left lobe: 5.2 cm x 1.9 cm x 1.8 cm

_________________________________________________________

Estimated total number of nodules >/= 1 cm: 2

Number of spongiform nodules >/=  2 cm not described below (TR1): 0

Number of mixed cystic and solid nodules >/= 1.5 cm not described
below (TR2): 0

_________________________________________________________

Nodule # 1:

Location: Right; Inferior

Maximum size: 1.1 cm; Other 2 dimensions: 0.7 cm x 1.0 cm

Composition: spongiform (0)

ACR TI-RADS recommendations:

Spongiform nodule does not meet criteria for surveillance or biopsy

_________________________________________________________

Nodule # 2:

Location: Right; Inferior

Maximum size: 0.8 cm; Other 2 dimensions: 0.6 cm x 0.8 cm

Composition: solid/almost completely solid (2)

Echogenicity: isoechoic (1)

Shape: not taller-than-wide (0)

Margins: ill-defined (0)

Echogenic foci: none (0)

ACR TI-RADS total points: 3.

ACR TI-RADS risk category: TR3 (3 points).

ACR TI-RADS recommendations:

Nodule does not meet criteria for surveillance or biopsy

_________________________________________________________

Nodule # 3:

Location: Left; Inferior

Maximum size: 2.4 cm; Other 2 dimensions: 1.5 cm x 2.0 cm

Composition: solid/almost completely solid (2)

Echogenicity: isoechoic (1)

Shape: not taller-than-wide (0)

Margins: ill-defined (0)

Echogenic foci: punctate echogenic foci (3)

ACR TI-RADS total points: 6.

ACR TI-RADS risk category: TR4 (4-6 points).

ACR TI-RADS recommendations:

Nodule meets criteria for biopsy

_________________________________________________________

Nodule # 4:

Location: Left; Inferior

Maximum size: 0.8 cm; Other 2 dimensions: 0.7 cm x 0.9 cm

Composition: mixed cystic and solid (1)

Echogenicity: isoechoic (1)

Shape: not taller-than-wide (0)

Margins: ill-defined (0)

Echogenic foci: none (0)

ACR TI-RADS total points: 2.

ACR TI-RADS risk category: TR2 (2 points).

ACR TI-RADS recommendations:

Nodule does not meet criteria for surveillance or biopsy

_________________________________________________________

No adenopathy
IMPRESSION: Left inferior thyroid nodule (labeled 3) meets criteria for biopsy,
as designated by the newly established ACR TI-RADS criteria, and
referral for biopsy is recommended.

Recommendations follow those established by the new ACR TI-RADS
criteria ([HOSPITAL] 4165;[DATE]).

## 2018-10-21 DIAGNOSIS — Z0000 Encounter for general adult medical examination without abnormal findings: Secondary | ICD-10-CM | POA: Diagnosis not present

## 2018-10-23 DIAGNOSIS — Z0000 Encounter for general adult medical examination without abnormal findings: Secondary | ICD-10-CM | POA: Diagnosis not present

## 2018-10-23 DIAGNOSIS — I10 Essential (primary) hypertension: Secondary | ICD-10-CM | POA: Diagnosis not present

## 2018-10-23 DIAGNOSIS — K219 Gastro-esophageal reflux disease without esophagitis: Secondary | ICD-10-CM | POA: Diagnosis not present

## 2018-10-23 DIAGNOSIS — E7800 Pure hypercholesterolemia, unspecified: Secondary | ICD-10-CM | POA: Diagnosis not present

## 2018-10-25 IMAGING — US US THYROID BIOPSY
1 series · 13 of 13 positions shown · non-contrast
Comparison: Thyroid ultrasound performed 09/19/2017

MEDICATIONS:
None

COMPLICATIONS:
None immediate.

INDICATION: Indeterminate left inferior thyroid nodule

EXAM:
ULTRASOUND GUIDED FINE NEEDLE ASPIRATION OF INDETERMINATE LEFT
INFERIOR THYROID NODULE
TECHNIQUE: Informed written consent was obtained from the patient after a
discussion of the risks, benefits and alternatives to treatment.
Questions regarding the procedure were encouraged and answered. A
timeout was performed prior to the initiation of the procedure.

[Series 1: us thyroid biopsy · 0.06mm/px · 13 acquisitions, 13 frames shown]
[im 1/13]
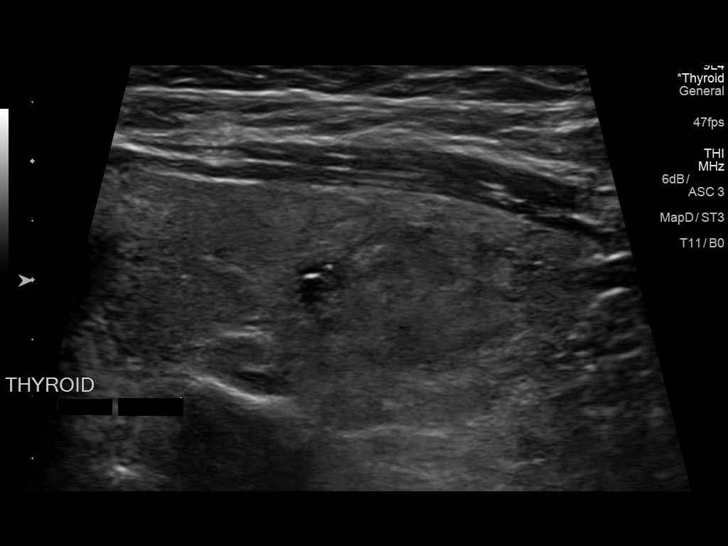
[im 2/13]
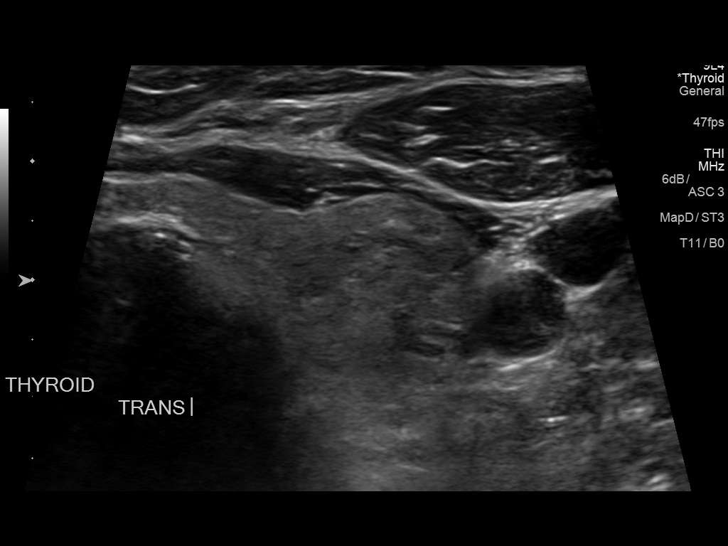
[im 3/13]
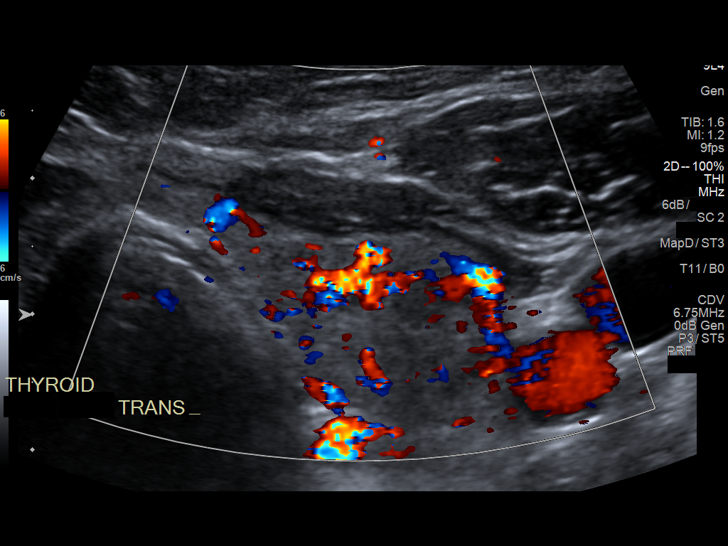
[im 4/13]
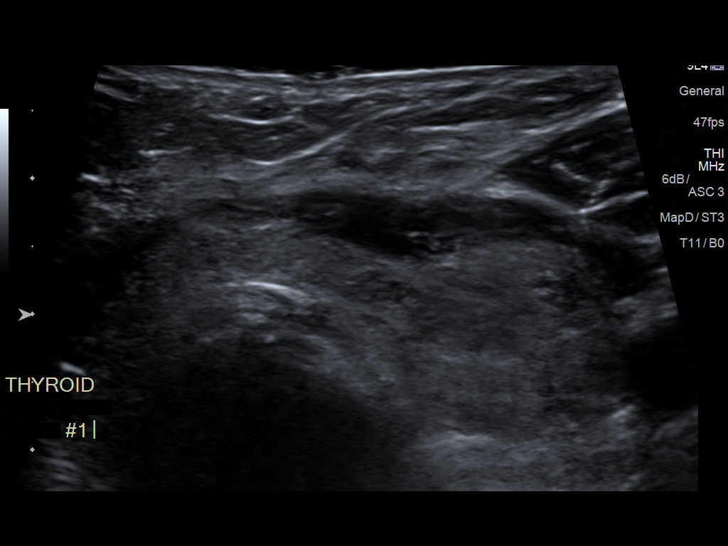
[im 5/13]
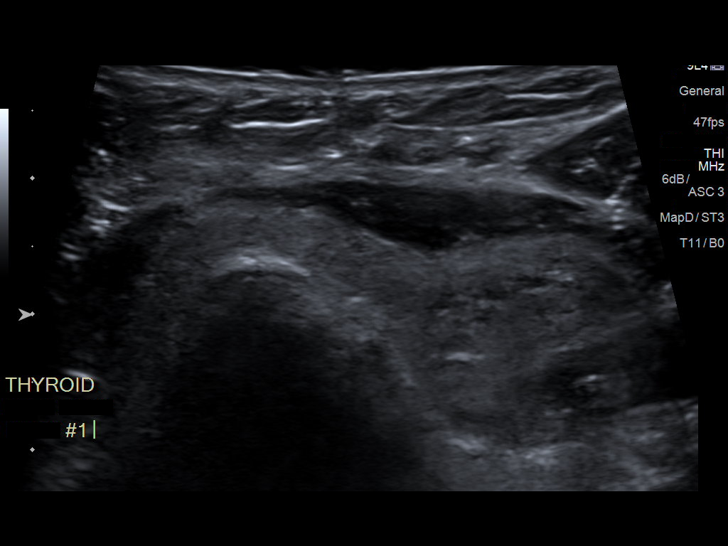
[im 6/13]
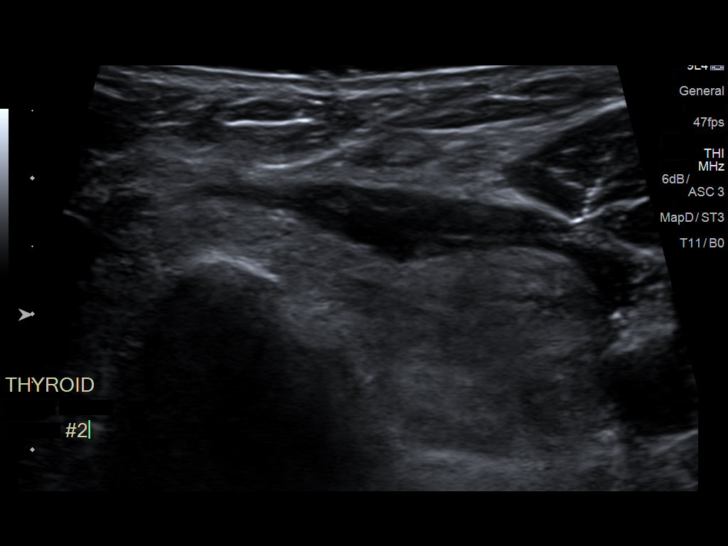
[im 7/13]
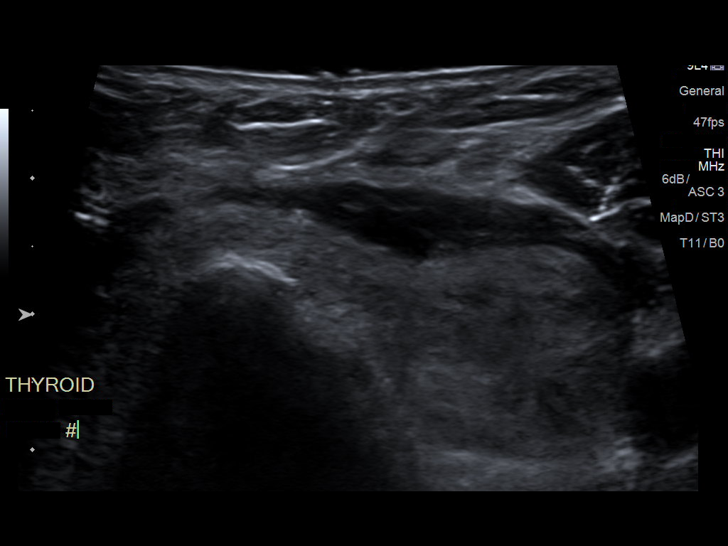
[im 8/13]
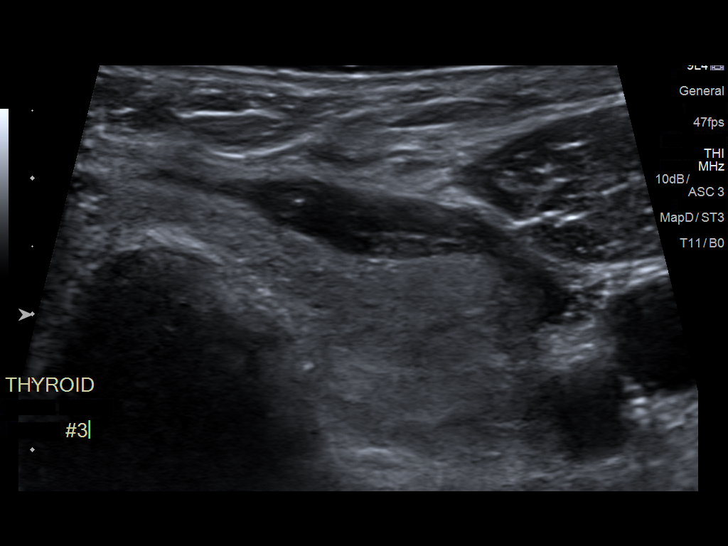
[im 9/13]
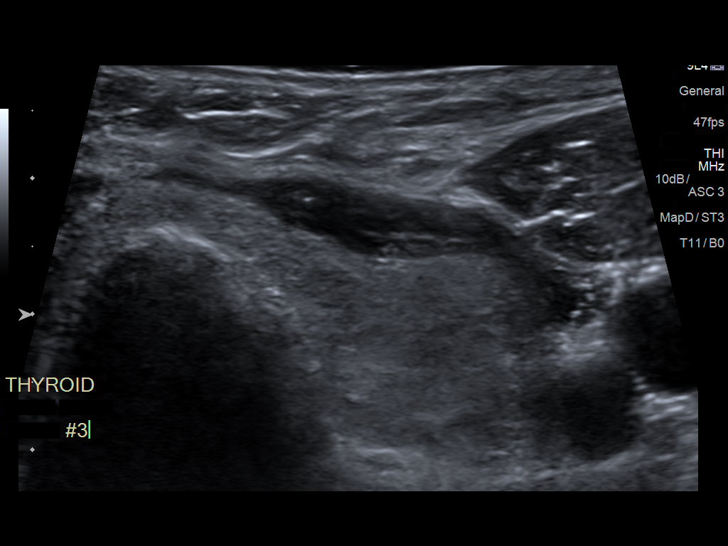
[im 10/13]
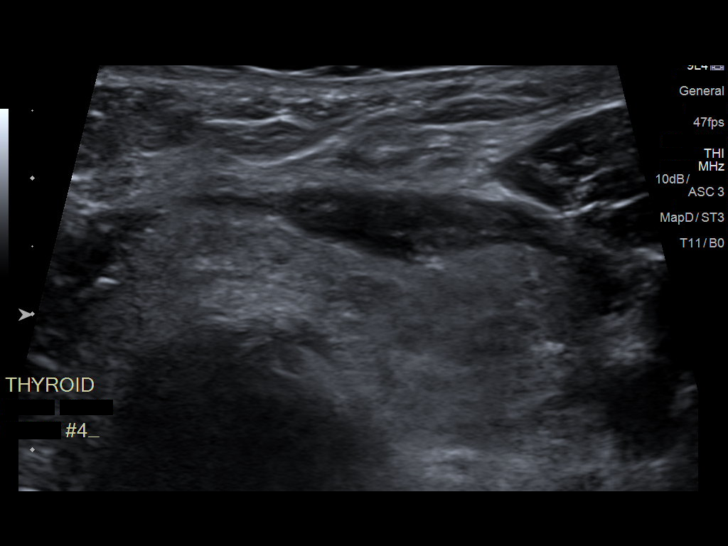
[im 11/13]
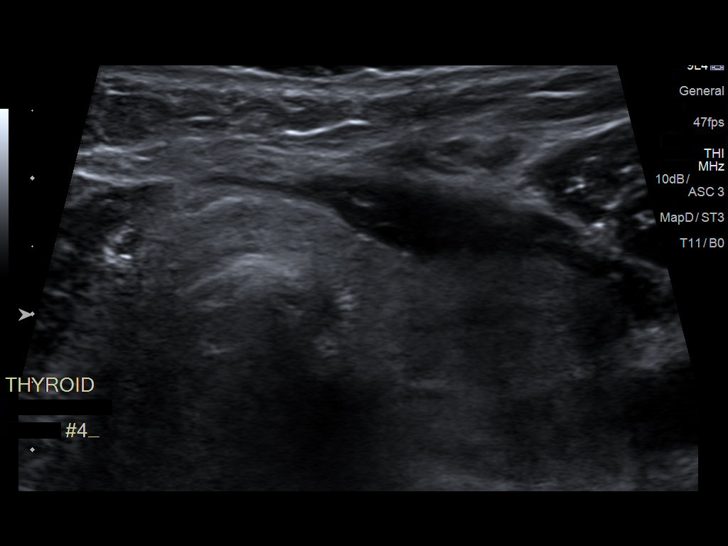
[im 12/13]
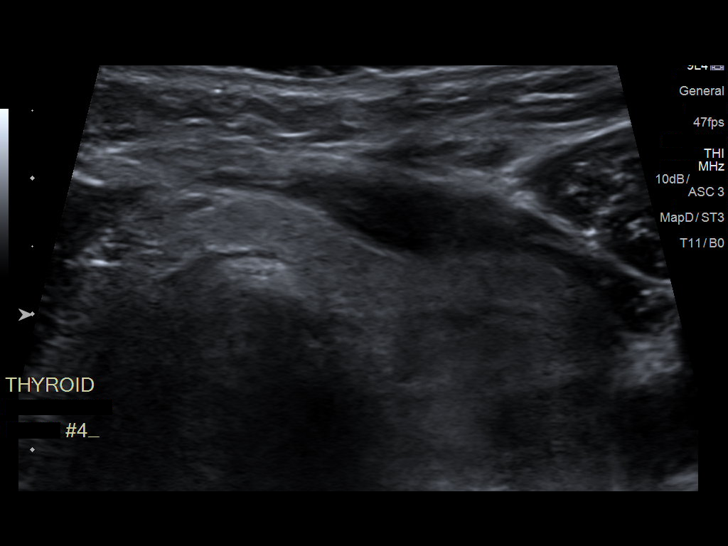
[im 13/13]
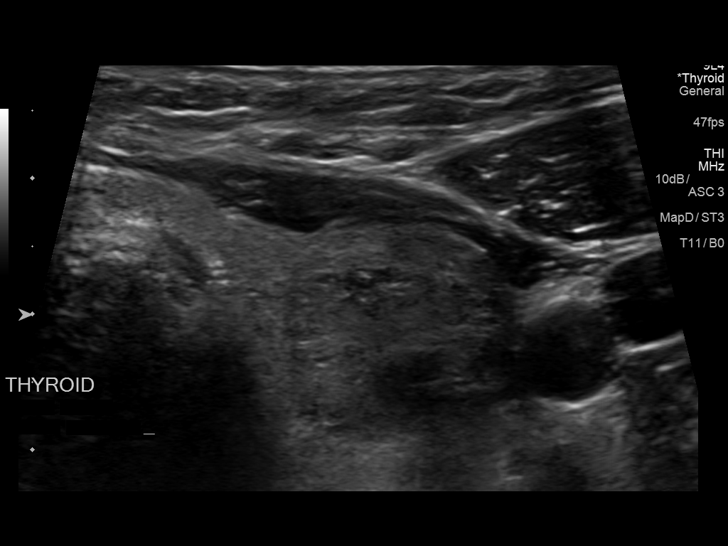

[13 of 13 positions shown; findings below may reference images not displayed]

Pre-procedural ultrasound scanning demonstrated unchanged size and
appearance of the indeterminate nodule within the left mid thyroid.

The procedure was planned. The neck was prepped in the usual sterile
fashion, and a sterile drape was applied covering the operative
field. A timeout was performed prior to the initiation of the
procedure. Local anesthesia was provided with 1% lidocaine.

Under direct ultrasound guidance, 4 FNA biopsies were performed of
the left inferior thyroid nodule with a 25 gauge needle. Multiple
ultrasound images were saved for procedural documentation purposes.
The samples were prepared and submitted to pathology.

Limited post procedural scanning was negative for hematoma or
additional complication. Dressings were placed. The patient
tolerated the above procedures procedure well without immediate
postprocedural complication.
FINDINGS: FINDINGS
Nodule reference number based on prior diagnostic ultrasound: 3

Maximum size:  2.4 cm

Location: Left  ;  Inferior

ACR TI-RADS total points: 6

ACR TI-RADS risk category:  TR4 (4-6 points)

Prior biopsy:  No

Reason for biopsy: meets ACR TI-RADS criteria

Ultrasound imaging confirms appropriate placement of the needles
within the thyroid nodule.
IMPRESSION: Technically successful ultrasound guided fine needle aspiration of
left inferior thyroid nodule as described above.

## 2018-11-17 DIAGNOSIS — L910 Hypertrophic scar: Secondary | ICD-10-CM | POA: Diagnosis not present

## 2018-11-17 DIAGNOSIS — L821 Other seborrheic keratosis: Secondary | ICD-10-CM | POA: Diagnosis not present

## 2018-11-17 DIAGNOSIS — D225 Melanocytic nevi of trunk: Secondary | ICD-10-CM | POA: Diagnosis not present

## 2018-11-17 DIAGNOSIS — L814 Other melanin hyperpigmentation: Secondary | ICD-10-CM | POA: Diagnosis not present

## 2019-01-26 DIAGNOSIS — C641 Malignant neoplasm of right kidney, except renal pelvis: Secondary | ICD-10-CM | POA: Diagnosis not present

## 2019-01-27 DIAGNOSIS — N2889 Other specified disorders of kidney and ureter: Secondary | ICD-10-CM | POA: Diagnosis not present

## 2019-01-27 DIAGNOSIS — C641 Malignant neoplasm of right kidney, except renal pelvis: Secondary | ICD-10-CM | POA: Diagnosis not present

## 2019-02-01 DIAGNOSIS — Z905 Acquired absence of kidney: Secondary | ICD-10-CM | POA: Diagnosis not present

## 2019-02-01 DIAGNOSIS — C641 Malignant neoplasm of right kidney, except renal pelvis: Secondary | ICD-10-CM | POA: Diagnosis not present

## 2019-02-01 DIAGNOSIS — N289 Disorder of kidney and ureter, unspecified: Secondary | ICD-10-CM | POA: Diagnosis not present

## 2019-03-18 DIAGNOSIS — Q600 Renal agenesis, unilateral: Secondary | ICD-10-CM | POA: Diagnosis not present

## 2019-04-15 DIAGNOSIS — G4733 Obstructive sleep apnea (adult) (pediatric): Secondary | ICD-10-CM | POA: Diagnosis not present

## 2019-08-04 DIAGNOSIS — K5730 Diverticulosis of large intestine without perforation or abscess without bleeding: Secondary | ICD-10-CM | POA: Diagnosis not present

## 2019-08-04 DIAGNOSIS — N281 Cyst of kidney, acquired: Secondary | ICD-10-CM | POA: Diagnosis not present

## 2019-08-04 DIAGNOSIS — K5900 Constipation, unspecified: Secondary | ICD-10-CM | POA: Diagnosis not present

## 2019-08-04 DIAGNOSIS — K4090 Unilateral inguinal hernia, without obstruction or gangrene, not specified as recurrent: Secondary | ICD-10-CM | POA: Diagnosis not present

## 2019-08-04 DIAGNOSIS — C641 Malignant neoplasm of right kidney, except renal pelvis: Secondary | ICD-10-CM | POA: Diagnosis not present

## 2019-08-09 DIAGNOSIS — G4733 Obstructive sleep apnea (adult) (pediatric): Secondary | ICD-10-CM | POA: Diagnosis not present

## 2019-09-16 DIAGNOSIS — N182 Chronic kidney disease, stage 2 (mild): Secondary | ICD-10-CM | POA: Diagnosis not present

## 2019-09-16 DIAGNOSIS — Q600 Renal agenesis, unilateral: Secondary | ICD-10-CM | POA: Diagnosis not present

## 2019-12-01 DIAGNOSIS — H43811 Vitreous degeneration, right eye: Secondary | ICD-10-CM | POA: Diagnosis not present

## 2019-12-29 DIAGNOSIS — H40013 Open angle with borderline findings, low risk, bilateral: Secondary | ICD-10-CM | POA: Diagnosis not present

## 2019-12-29 DIAGNOSIS — H43811 Vitreous degeneration, right eye: Secondary | ICD-10-CM | POA: Diagnosis not present

## 2020-01-13 DIAGNOSIS — J22 Unspecified acute lower respiratory infection: Secondary | ICD-10-CM | POA: Diagnosis not present

## 2020-02-16 DIAGNOSIS — J383 Other diseases of vocal cords: Secondary | ICD-10-CM | POA: Diagnosis not present

## 2020-02-16 DIAGNOSIS — J384 Edema of larynx: Secondary | ICD-10-CM | POA: Diagnosis not present

## 2020-02-16 DIAGNOSIS — R490 Dysphonia: Secondary | ICD-10-CM | POA: Diagnosis not present

## 2020-02-18 DIAGNOSIS — Z6837 Body mass index (BMI) 37.0-37.9, adult: Secondary | ICD-10-CM | POA: Diagnosis not present

## 2020-02-18 DIAGNOSIS — Z125 Encounter for screening for malignant neoplasm of prostate: Secondary | ICD-10-CM | POA: Diagnosis not present

## 2020-02-18 DIAGNOSIS — Z13220 Encounter for screening for lipoid disorders: Secondary | ICD-10-CM | POA: Diagnosis not present

## 2020-02-18 DIAGNOSIS — Z0000 Encounter for general adult medical examination without abnormal findings: Secondary | ICD-10-CM | POA: Diagnosis not present

## 2020-02-22 DIAGNOSIS — E7800 Pure hypercholesterolemia, unspecified: Secondary | ICD-10-CM | POA: Diagnosis not present

## 2020-02-22 DIAGNOSIS — R972 Elevated prostate specific antigen [PSA]: Secondary | ICD-10-CM | POA: Diagnosis not present

## 2020-02-22 DIAGNOSIS — Z0000 Encounter for general adult medical examination without abnormal findings: Secondary | ICD-10-CM | POA: Diagnosis not present

## 2020-02-22 DIAGNOSIS — I10 Essential (primary) hypertension: Secondary | ICD-10-CM | POA: Diagnosis not present

## 2020-02-22 DIAGNOSIS — K219 Gastro-esophageal reflux disease without esophagitis: Secondary | ICD-10-CM | POA: Diagnosis not present

## 2020-04-12 DIAGNOSIS — C641 Malignant neoplasm of right kidney, except renal pelvis: Secondary | ICD-10-CM | POA: Diagnosis not present

## 2020-04-25 DIAGNOSIS — G4733 Obstructive sleep apnea (adult) (pediatric): Secondary | ICD-10-CM | POA: Diagnosis not present

## 2020-05-30 DIAGNOSIS — L219 Seborrheic dermatitis, unspecified: Secondary | ICD-10-CM | POA: Diagnosis not present

## 2020-05-30 DIAGNOSIS — L578 Other skin changes due to chronic exposure to nonionizing radiation: Secondary | ICD-10-CM | POA: Diagnosis not present

## 2020-05-30 DIAGNOSIS — L570 Actinic keratosis: Secondary | ICD-10-CM | POA: Diagnosis not present

## 2020-05-30 DIAGNOSIS — D225 Melanocytic nevi of trunk: Secondary | ICD-10-CM | POA: Diagnosis not present

## 2020-05-30 DIAGNOSIS — L910 Hypertrophic scar: Secondary | ICD-10-CM | POA: Diagnosis not present

## 2020-06-02 DIAGNOSIS — H25811 Combined forms of age-related cataract, right eye: Secondary | ICD-10-CM | POA: Diagnosis not present

## 2020-06-02 DIAGNOSIS — H25812 Combined forms of age-related cataract, left eye: Secondary | ICD-10-CM | POA: Diagnosis not present

## 2020-06-02 DIAGNOSIS — H40031 Anatomical narrow angle, right eye: Secondary | ICD-10-CM | POA: Diagnosis not present

## 2020-06-02 DIAGNOSIS — H40032 Anatomical narrow angle, left eye: Secondary | ICD-10-CM | POA: Diagnosis not present

## 2020-06-22 DIAGNOSIS — R972 Elevated prostate specific antigen [PSA]: Secondary | ICD-10-CM | POA: Diagnosis not present

## 2020-08-09 DIAGNOSIS — G4733 Obstructive sleep apnea (adult) (pediatric): Secondary | ICD-10-CM | POA: Diagnosis not present

## 2020-09-13 DIAGNOSIS — H469 Unspecified optic neuritis: Secondary | ICD-10-CM

## 2020-09-13 HISTORY — DX: Unspecified optic neuritis: H46.9

## 2020-09-14 DIAGNOSIS — Z905 Acquired absence of kidney: Secondary | ICD-10-CM | POA: Diagnosis not present

## 2020-09-14 DIAGNOSIS — C649 Malignant neoplasm of unspecified kidney, except renal pelvis: Secondary | ICD-10-CM | POA: Diagnosis not present

## 2020-09-14 DIAGNOSIS — N182 Chronic kidney disease, stage 2 (mild): Secondary | ICD-10-CM | POA: Diagnosis not present

## 2020-09-18 DIAGNOSIS — C641 Malignant neoplasm of right kidney, except renal pelvis: Secondary | ICD-10-CM | POA: Diagnosis not present

## 2020-09-18 DIAGNOSIS — I10 Essential (primary) hypertension: Secondary | ICD-10-CM | POA: Diagnosis not present

## 2020-09-18 DIAGNOSIS — Z7982 Long term (current) use of aspirin: Secondary | ICD-10-CM | POA: Diagnosis not present

## 2020-09-18 DIAGNOSIS — N281 Cyst of kidney, acquired: Secondary | ICD-10-CM | POA: Diagnosis not present

## 2020-09-18 DIAGNOSIS — E7800 Pure hypercholesterolemia, unspecified: Secondary | ICD-10-CM | POA: Diagnosis not present

## 2020-09-18 DIAGNOSIS — H5712 Ocular pain, left eye: Secondary | ICD-10-CM | POA: Diagnosis not present

## 2020-09-18 DIAGNOSIS — K5730 Diverticulosis of large intestine without perforation or abscess without bleeding: Secondary | ICD-10-CM | POA: Diagnosis not present

## 2020-09-18 DIAGNOSIS — N2889 Other specified disorders of kidney and ureter: Secondary | ICD-10-CM | POA: Diagnosis not present

## 2020-09-18 DIAGNOSIS — G4733 Obstructive sleep apnea (adult) (pediatric): Secondary | ICD-10-CM | POA: Diagnosis not present

## 2020-09-18 DIAGNOSIS — H40031 Anatomical narrow angle, right eye: Secondary | ICD-10-CM | POA: Diagnosis not present

## 2020-09-18 DIAGNOSIS — H469 Unspecified optic neuritis: Secondary | ICD-10-CM | POA: Diagnosis not present

## 2020-09-18 DIAGNOSIS — R9082 White matter disease, unspecified: Secondary | ICD-10-CM | POA: Diagnosis not present

## 2020-09-18 DIAGNOSIS — H538 Other visual disturbances: Secondary | ICD-10-CM | POA: Diagnosis not present

## 2020-09-18 DIAGNOSIS — H25812 Combined forms of age-related cataract, left eye: Secondary | ICD-10-CM | POA: Diagnosis not present

## 2020-09-18 DIAGNOSIS — H25811 Combined forms of age-related cataract, right eye: Secondary | ICD-10-CM | POA: Diagnosis not present

## 2020-09-18 DIAGNOSIS — H40032 Anatomical narrow angle, left eye: Secondary | ICD-10-CM | POA: Diagnosis not present

## 2020-09-18 DIAGNOSIS — Z79899 Other long term (current) drug therapy: Secondary | ICD-10-CM | POA: Diagnosis not present

## 2020-09-21 DIAGNOSIS — Z113 Encounter for screening for infections with a predominantly sexual mode of transmission: Secondary | ICD-10-CM | POA: Diagnosis not present

## 2020-09-21 DIAGNOSIS — I10 Essential (primary) hypertension: Secondary | ICD-10-CM | POA: Diagnosis not present

## 2020-09-21 DIAGNOSIS — H4710 Unspecified papilledema: Secondary | ICD-10-CM | POA: Diagnosis not present

## 2020-09-21 DIAGNOSIS — Z79899 Other long term (current) drug therapy: Secondary | ICD-10-CM | POA: Diagnosis not present

## 2020-09-21 DIAGNOSIS — H5462 Unqualified visual loss, left eye, normal vision right eye: Secondary | ICD-10-CM | POA: Diagnosis not present

## 2020-09-21 DIAGNOSIS — H47012 Ischemic optic neuropathy, left eye: Secondary | ICD-10-CM | POA: Diagnosis not present

## 2020-09-21 DIAGNOSIS — Z118 Encounter for screening for other infectious and parasitic diseases: Secondary | ICD-10-CM | POA: Diagnosis not present

## 2020-09-28 DIAGNOSIS — H469 Unspecified optic neuritis: Secondary | ICD-10-CM | POA: Diagnosis not present

## 2020-09-28 DIAGNOSIS — H5710 Ocular pain, unspecified eye: Secondary | ICD-10-CM | POA: Diagnosis not present

## 2020-09-28 DIAGNOSIS — H538 Other visual disturbances: Secondary | ICD-10-CM | POA: Diagnosis not present

## 2020-09-28 DIAGNOSIS — H547 Unspecified visual loss: Secondary | ICD-10-CM | POA: Diagnosis not present

## 2020-09-28 DIAGNOSIS — H47012 Ischemic optic neuropathy, left eye: Secondary | ICD-10-CM | POA: Diagnosis not present

## 2020-09-29 DIAGNOSIS — H47012 Ischemic optic neuropathy, left eye: Secondary | ICD-10-CM | POA: Diagnosis not present

## 2020-09-29 DIAGNOSIS — H468 Other optic neuritis: Secondary | ICD-10-CM | POA: Diagnosis not present

## 2020-09-30 DIAGNOSIS — H47012 Ischemic optic neuropathy, left eye: Secondary | ICD-10-CM | POA: Diagnosis not present

## 2020-09-30 DIAGNOSIS — H468 Other optic neuritis: Secondary | ICD-10-CM | POA: Diagnosis not present

## 2020-10-05 DIAGNOSIS — H469 Unspecified optic neuritis: Secondary | ICD-10-CM | POA: Diagnosis not present

## 2020-10-05 DIAGNOSIS — H4710 Unspecified papilledema: Secondary | ICD-10-CM | POA: Diagnosis not present

## 2020-10-05 DIAGNOSIS — H40033 Anatomical narrow angle, bilateral: Secondary | ICD-10-CM | POA: Diagnosis not present

## 2020-10-05 DIAGNOSIS — H5462 Unqualified visual loss, left eye, normal vision right eye: Secondary | ICD-10-CM | POA: Diagnosis not present

## 2020-10-05 DIAGNOSIS — H4089 Other specified glaucoma: Secondary | ICD-10-CM | POA: Diagnosis not present

## 2020-10-05 DIAGNOSIS — H2513 Age-related nuclear cataract, bilateral: Secondary | ICD-10-CM | POA: Diagnosis not present

## 2020-10-09 DIAGNOSIS — G479 Sleep disorder, unspecified: Secondary | ICD-10-CM | POA: Diagnosis not present

## 2020-10-09 DIAGNOSIS — I10 Essential (primary) hypertension: Secondary | ICD-10-CM | POA: Diagnosis not present

## 2020-10-18 DIAGNOSIS — Z905 Acquired absence of kidney: Secondary | ICD-10-CM | POA: Diagnosis not present

## 2020-10-18 DIAGNOSIS — C641 Malignant neoplasm of right kidney, except renal pelvis: Secondary | ICD-10-CM | POA: Diagnosis not present

## 2020-10-18 DIAGNOSIS — N289 Disorder of kidney and ureter, unspecified: Secondary | ICD-10-CM | POA: Diagnosis not present

## 2020-11-02 DIAGNOSIS — H469 Unspecified optic neuritis: Secondary | ICD-10-CM | POA: Diagnosis not present

## 2020-11-02 DIAGNOSIS — H40033 Anatomical narrow angle, bilateral: Secondary | ICD-10-CM | POA: Diagnosis not present

## 2020-11-02 DIAGNOSIS — G378 Other specified demyelinating diseases of central nervous system: Secondary | ICD-10-CM | POA: Diagnosis not present

## 2020-11-02 DIAGNOSIS — H2513 Age-related nuclear cataract, bilateral: Secondary | ICD-10-CM | POA: Diagnosis not present

## 2020-11-12 DIAGNOSIS — H468 Other optic neuritis: Secondary | ICD-10-CM | POA: Diagnosis not present

## 2020-11-12 DIAGNOSIS — H40033 Anatomical narrow angle, bilateral: Secondary | ICD-10-CM | POA: Diagnosis not present

## 2020-11-12 DIAGNOSIS — H5712 Ocular pain, left eye: Secondary | ICD-10-CM | POA: Diagnosis not present

## 2020-11-12 DIAGNOSIS — H2513 Age-related nuclear cataract, bilateral: Secondary | ICD-10-CM | POA: Diagnosis not present

## 2020-11-12 DIAGNOSIS — E7212 Methylenetetrahydrofolate reductase deficiency: Secondary | ICD-10-CM | POA: Diagnosis not present

## 2020-11-12 DIAGNOSIS — H538 Other visual disturbances: Secondary | ICD-10-CM | POA: Diagnosis not present

## 2020-11-13 DIAGNOSIS — G360 Neuromyelitis optica [Devic]: Secondary | ICD-10-CM | POA: Diagnosis not present

## 2020-11-14 DIAGNOSIS — H469 Unspecified optic neuritis: Secondary | ICD-10-CM | POA: Diagnosis not present

## 2020-11-20 DIAGNOSIS — G378 Other specified demyelinating diseases of central nervous system: Secondary | ICD-10-CM | POA: Diagnosis not present

## 2020-11-20 DIAGNOSIS — H2513 Age-related nuclear cataract, bilateral: Secondary | ICD-10-CM | POA: Diagnosis not present

## 2020-11-20 DIAGNOSIS — H469 Unspecified optic neuritis: Secondary | ICD-10-CM | POA: Diagnosis not present

## 2020-11-20 DIAGNOSIS — Z79899 Other long term (current) drug therapy: Secondary | ICD-10-CM | POA: Diagnosis not present

## 2020-11-20 DIAGNOSIS — H40033 Anatomical narrow angle, bilateral: Secondary | ICD-10-CM | POA: Diagnosis not present

## 2020-11-22 DIAGNOSIS — M4802 Spinal stenosis, cervical region: Secondary | ICD-10-CM | POA: Diagnosis not present

## 2020-11-22 DIAGNOSIS — H469 Unspecified optic neuritis: Secondary | ICD-10-CM | POA: Diagnosis not present

## 2020-11-22 DIAGNOSIS — H547 Unspecified visual loss: Secondary | ICD-10-CM | POA: Diagnosis not present

## 2020-11-22 DIAGNOSIS — R930 Abnormal findings on diagnostic imaging of skull and head, not elsewhere classified: Secondary | ICD-10-CM | POA: Diagnosis not present

## 2020-11-24 DIAGNOSIS — H469 Unspecified optic neuritis: Secondary | ICD-10-CM | POA: Diagnosis not present

## 2020-11-27 DIAGNOSIS — H469 Unspecified optic neuritis: Secondary | ICD-10-CM | POA: Diagnosis not present

## 2020-11-28 DIAGNOSIS — H469 Unspecified optic neuritis: Secondary | ICD-10-CM | POA: Diagnosis not present

## 2020-11-30 DIAGNOSIS — H469 Unspecified optic neuritis: Secondary | ICD-10-CM | POA: Diagnosis not present

## 2020-12-01 DIAGNOSIS — H469 Unspecified optic neuritis: Secondary | ICD-10-CM | POA: Diagnosis not present

## 2020-12-04 DIAGNOSIS — H469 Unspecified optic neuritis: Secondary | ICD-10-CM | POA: Diagnosis not present

## 2020-12-04 DIAGNOSIS — Z452 Encounter for adjustment and management of vascular access device: Secondary | ICD-10-CM | POA: Diagnosis not present

## 2020-12-04 DIAGNOSIS — G379 Demyelinating disease of central nervous system, unspecified: Secondary | ICD-10-CM | POA: Diagnosis not present

## 2020-12-18 DIAGNOSIS — G360 Neuromyelitis optica [Devic]: Secondary | ICD-10-CM | POA: Diagnosis not present

## 2021-01-04 DIAGNOSIS — H2513 Age-related nuclear cataract, bilateral: Secondary | ICD-10-CM | POA: Diagnosis not present

## 2021-01-04 DIAGNOSIS — H469 Unspecified optic neuritis: Secondary | ICD-10-CM | POA: Diagnosis not present

## 2021-01-04 DIAGNOSIS — Z79899 Other long term (current) drug therapy: Secondary | ICD-10-CM | POA: Diagnosis not present

## 2021-01-04 DIAGNOSIS — G378 Other specified demyelinating diseases of central nervous system: Secondary | ICD-10-CM | POA: Diagnosis not present

## 2021-01-04 DIAGNOSIS — H40033 Anatomical narrow angle, bilateral: Secondary | ICD-10-CM | POA: Diagnosis not present

## 2021-01-12 DIAGNOSIS — G378 Other specified demyelinating diseases of central nervous system: Secondary | ICD-10-CM | POA: Diagnosis not present

## 2021-01-31 DIAGNOSIS — L910 Hypertrophic scar: Secondary | ICD-10-CM | POA: Diagnosis not present

## 2021-02-02 DIAGNOSIS — H469 Unspecified optic neuritis: Secondary | ICD-10-CM | POA: Diagnosis not present

## 2021-02-03 DIAGNOSIS — H469 Unspecified optic neuritis: Secondary | ICD-10-CM | POA: Diagnosis not present

## 2021-02-04 DIAGNOSIS — H469 Unspecified optic neuritis: Secondary | ICD-10-CM | POA: Diagnosis not present

## 2021-02-21 DIAGNOSIS — G379 Demyelinating disease of central nervous system, unspecified: Secondary | ICD-10-CM | POA: Diagnosis not present

## 2021-03-05 DIAGNOSIS — H469 Unspecified optic neuritis: Secondary | ICD-10-CM | POA: Diagnosis not present

## 2021-03-05 DIAGNOSIS — H40032 Anatomical narrow angle, left eye: Secondary | ICD-10-CM | POA: Diagnosis not present

## 2021-03-05 DIAGNOSIS — H25811 Combined forms of age-related cataract, right eye: Secondary | ICD-10-CM | POA: Diagnosis not present

## 2021-03-05 DIAGNOSIS — H40031 Anatomical narrow angle, right eye: Secondary | ICD-10-CM | POA: Diagnosis not present

## 2021-03-27 DIAGNOSIS — G4733 Obstructive sleep apnea (adult) (pediatric): Secondary | ICD-10-CM | POA: Diagnosis not present

## 2021-03-30 ENCOUNTER — Other Ambulatory Visit: Payer: Self-pay

## 2021-03-30 ENCOUNTER — Encounter: Payer: Self-pay | Admitting: Emergency Medicine

## 2021-03-30 ENCOUNTER — Emergency Department (INDEPENDENT_AMBULATORY_CARE_PROVIDER_SITE_OTHER)
Admission: EM | Admit: 2021-03-30 | Discharge: 2021-03-30 | Disposition: A | Discharge status: Home / Self Care | Payer: BC Managed Care – PPO | Source: Home / Self Care | Attending: Family Medicine | Admitting: Family Medicine

## 2021-03-30 DIAGNOSIS — K429 Umbilical hernia without obstruction or gangrene: Secondary | ICD-10-CM | POA: Diagnosis not present

## 2021-03-30 DIAGNOSIS — S30863A Insect bite (nonvenomous) of scrotum and testes, initial encounter: Secondary | ICD-10-CM | POA: Diagnosis not present

## 2021-03-30 DIAGNOSIS — Z905 Acquired absence of kidney: Secondary | ICD-10-CM | POA: Diagnosis not present

## 2021-03-30 DIAGNOSIS — W57XXXA Bitten or stung by nonvenomous insect and other nonvenomous arthropods, initial encounter: Secondary | ICD-10-CM | POA: Diagnosis not present

## 2021-03-30 DIAGNOSIS — N400 Enlarged prostate without lower urinary tract symptoms: Secondary | ICD-10-CM | POA: Diagnosis not present

## 2021-03-30 DIAGNOSIS — K4090 Unilateral inguinal hernia, without obstruction or gangrene, not specified as recurrent: Secondary | ICD-10-CM | POA: Diagnosis not present

## 2021-03-30 DIAGNOSIS — K5730 Diverticulosis of large intestine without perforation or abscess without bleeding: Secondary | ICD-10-CM | POA: Diagnosis not present

## 2021-03-30 MED ORDER — DOXYCYCLINE HYCLATE 100 MG PO CAPS: ORAL_CAPSULE | ORAL | 0 refills | Status: AC

## 2021-03-30 NOTE — ED Triage Notes (Signed)
Pt found a tick at the base of his penis on the right side this am  Removed with tweezers by his wife  Wanted to see if he needed an antibiotic [place] tick bite

## 2021-03-30 NOTE — ED Provider Notes (Signed)
[facility]    CSN: [id number] Arrival date & time: 03/30/21  0848      History   Chief Complaint Chief Complaint  Patient presents with   Tick Removal    HPI Jordan Oconnor is a 65 y.o. male.   Patient discovered a small non-engorged embedded tick on his right scrotum last night.  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  He denies pain or swelling.  He wonders if he might need an antibiotic.  The history is provided by the patient.   Past Medical History:  Diagnosis Date   Hyperlipidemia    Hypertension    Optic neuritis 09/2020   left    Patient Active Problem List   Diagnosis Date Noted   Hyperlipidemia    Hypertension     Past Surgical History:  Procedure Laterality Date   APPENDECTOMY     COLONOSCOPY WITH PROPOFOL N/A 11/11/2016   Procedure: COLONOSCOPY WITH PROPOFOL;  Surgeon: Charolett Bumpers, MD;  Location: [facility];  Service: Endoscopy;  Laterality: N/A;   NEPHRECTOMY Right 08/2018       Home Medications    Prior to Admission medications   Medication Sig Start Date End Date Taking? Authorizing Provider  brimonidine (ALPHAGAN) 0.2 % ophthalmic solution 1 drop into affected eye 11/15/20  Yes [provider]  doxycycline (VIBRAMYCIN) 100 MG capsule Take one cap PO Q12hr with food. 03/30/21  Yes Lattie Haw, MD  hydrochlorothiazide (HYDRODIURIL) 12.5 MG tablet 1 tablet in the morning 09/21/20  Yes [provider]  latanoprost (XALATAN) 0.005 % ophthalmic solution 1 drop into affected eye in the evening 01/04/21  Yes [provider]  riTUXimab (RITUXAN IV) Inject 1,000 mg into the vein every 6 (six) months.   Yes [provider]  aspirin 81 MG tablet Take 81 mg by mouth daily.    [provider]  BIOTIN PO Take 1 tablet by mouth daily. Patient not taking: Reported on 03/30/2021    [provider]  esomeprazole (NEXIUM) 40 MG capsule Take 40 mg  by mouth every other day.    [provider]  losartan (COZAAR) 100 MG tablet Take 100 mg by mouth daily.    [provider]  magnesium oxide (MAG-OX) 400 MG tablet Take 400 mg by mouth daily.    [provider]  Multiple Vitamin (MULTIVITAMIN WITH MINERALS) TABS tablet Take 1 tablet by mouth daily.    [provider]  Omega-3 Fatty Acids (FISH OIL OMEGA-3) 1000 MG CAPS 1 capsule    [provider]  rosuvastatin (CRESTOR) 10 MG tablet Take 10 mg by mouth daily.    [provider]  traZODone (DESYREL) 100 MG tablet Take 100 mg by mouth at bedtime.    [provider]  zinc gluconate 50 MG tablet Take by mouth.    [provider]    Family History Family History  Problem Relation Age of Onset   Cancer Mother        colon   Heart attack Father     Social History Social History   Tobacco Use   Smoking status: Never   Smokeless tobacco: Never  Substance Use Topics   Alcohol use: No   Drug use: No     Allergies   Nsaids   Review of Systems Review of Systems  Constitutional:  Negative for appetite change, chills, diaphoresis, fatigue and fever.  Musculoskeletal:  Negative for  arthralgias and joint swelling.  Skin:  Positive for wound. Negative for rash.  Neurological:  Negative for headaches.  All other systems reviewed and are negative.   Physical Exam Triage Vital Signs ED Triage Vitals  Enc Vitals Group     BP 03/30/21 0906 (!) 156/103     Pulse Rate 03/30/21 0906 84     Resp 03/30/21 0906 16     Temp 03/30/21 0906 98.7 F (37.1 C)     Temp Source 03/30/21 0906 Oral     SpO2 03/30/21 0906 98 %     Weight 03/30/21 0908 230 lb (104.3 kg)     Height 03/30/21 0908 5\' 8"  (1.727 m)     Head Circumference --      Peak Flow --      Pain Score 03/30/21 0908 0     Pain Loc --      Pain Edu? --      Excl. in GC? --    No data found.  Updated Vital Signs BP (!) 156/103 (BP Location: Right Arm)  Comment: HTN- has not taken BP meds today  Pulse 84   Temp 98.7 F (37.1 C) (Oral)   Resp 16   Ht 5\' 8"  (1.727 m)   Wt 104.3 kg   SpO2 98%   BMI 34.97 kg/m   Visual Acuity Right Eye Distance:   Left Eye Distance:   Bilateral Distance:    Right Eye Near:   Left Eye Near:    Bilateral Near:     Physical Exam Vitals and nursing note reviewed.  Constitutional:      General: He is not in acute distress. HENT:     Head: Normocephalic.  Eyes:     Pupils: Pupils are equal, round, and reactive to light.  Cardiovascular:     Rate and Rhythm: Normal rate.  Pulmonary:     Effort: Pulmonary effort is normal.  Genitourinary:    Comments: Base of right scrotum at tick bite site has a small 3 to 50mm diameter of erythema without swelling. Skin:    General: Skin is warm and dry.  Neurological:     Mental Status: He is alert and oriented to person, place, and time.     UC Treatments / Results  Labs (all labs ordered are listed, but only abnormal results are displayed) Labs Reviewed - No data to display  EKG   Radiology No results found.  Procedures Procedures (including critical care time)  Medications Ordered in UC Medications - No data to display  Initial Impression / Assessment and Plan / UC Course  I have reviewed the triage vital signs and the nursing notes.  Pertinent labs & imaging results that were available during my care of the patient were reviewed by me and considered in my medical decision making (see chart for details).    Because of the possibility of STARI will begin doxycycline for 10 days. Given information on Alpha-gal Syndrome.  Recommend follow-up with allergist if rash develops.  Final Clinical Impressions(s) / UC Diagnoses   Final diagnoses:  Tick bite of scrotum, initial encounter     Discharge Instructions      If symptoms become significantly worse during the night or over the weekend, proceed to the local emergency room.    ED  Prescriptions     Medication Sig Dispense Auth. Provider   doxycycline (VIBRAMYCIN) 100 MG capsule Take one cap PO Q12hr with food. 20 capsule , MD  Lattie Haw, MD 04/01/21 1247

## 2021-03-30 NOTE — Discharge Instructions (Signed)
If symptoms become significantly worse during the night or over the weekend, proceed to the local emergency room.  

## 2021-04-18 DIAGNOSIS — N289 Disorder of kidney and ureter, unspecified: Secondary | ICD-10-CM | POA: Diagnosis not present

## 2021-04-18 DIAGNOSIS — C641 Malignant neoplasm of right kidney, except renal pelvis: Secondary | ICD-10-CM | POA: Diagnosis not present

## 2021-04-18 DIAGNOSIS — Z905 Acquired absence of kidney: Secondary | ICD-10-CM | POA: Diagnosis not present

## 2021-04-18 DIAGNOSIS — N400 Enlarged prostate without lower urinary tract symptoms: Secondary | ICD-10-CM | POA: Diagnosis not present

## 2021-04-20 DIAGNOSIS — H2513 Age-related nuclear cataract, bilateral: Secondary | ICD-10-CM | POA: Diagnosis not present

## 2021-04-20 DIAGNOSIS — G378 Other specified demyelinating diseases of central nervous system: Secondary | ICD-10-CM | POA: Diagnosis not present

## 2021-04-20 DIAGNOSIS — H40039 Anatomical narrow angle, unspecified eye: Secondary | ICD-10-CM | POA: Diagnosis not present

## 2021-05-03 DIAGNOSIS — G378 Other specified demyelinating diseases of central nervous system: Secondary | ICD-10-CM | POA: Diagnosis not present

## 2021-05-18 DIAGNOSIS — T63441A Toxic effect of venom of bees, accidental (unintentional), initial encounter: Secondary | ICD-10-CM | POA: Diagnosis not present

## 2021-05-18 DIAGNOSIS — Y929 Unspecified place or not applicable: Secondary | ICD-10-CM | POA: Diagnosis not present

## 2021-05-18 DIAGNOSIS — Y999 Unspecified external cause status: Secondary | ICD-10-CM | POA: Diagnosis not present

## 2021-05-18 DIAGNOSIS — X58XXXA Exposure to other specified factors, initial encounter: Secondary | ICD-10-CM | POA: Diagnosis not present

## 2021-05-18 DIAGNOSIS — R220 Localized swelling, mass and lump, head: Secondary | ICD-10-CM | POA: Diagnosis not present

## 2021-05-18 DIAGNOSIS — T782XXA Anaphylactic shock, unspecified, initial encounter: Secondary | ICD-10-CM | POA: Diagnosis not present

## 2021-05-23 DIAGNOSIS — U071 COVID-19: Secondary | ICD-10-CM | POA: Diagnosis not present

## 2021-06-07 DIAGNOSIS — H469 Unspecified optic neuritis: Secondary | ICD-10-CM | POA: Diagnosis not present

## 2021-06-07 DIAGNOSIS — Z79899 Other long term (current) drug therapy: Secondary | ICD-10-CM | POA: Diagnosis not present

## 2021-06-11 DIAGNOSIS — E782 Mixed hyperlipidemia: Secondary | ICD-10-CM | POA: Diagnosis not present

## 2021-06-11 DIAGNOSIS — K219 Gastro-esophageal reflux disease without esophagitis: Secondary | ICD-10-CM | POA: Diagnosis not present

## 2021-06-11 DIAGNOSIS — E7800 Pure hypercholesterolemia, unspecified: Secondary | ICD-10-CM | POA: Diagnosis not present

## 2021-06-11 DIAGNOSIS — Z0000 Encounter for general adult medical examination without abnormal findings: Secondary | ICD-10-CM | POA: Diagnosis not present

## 2021-06-11 DIAGNOSIS — I10 Essential (primary) hypertension: Secondary | ICD-10-CM | POA: Diagnosis not present

## 2021-06-11 DIAGNOSIS — E041 Nontoxic single thyroid nodule: Secondary | ICD-10-CM | POA: Diagnosis not present

## 2021-06-21 DIAGNOSIS — H469 Unspecified optic neuritis: Secondary | ICD-10-CM | POA: Diagnosis not present

## 2021-06-22 DIAGNOSIS — H469 Unspecified optic neuritis: Secondary | ICD-10-CM | POA: Diagnosis not present

## 2021-06-22 DIAGNOSIS — H04123 Dry eye syndrome of bilateral lacrimal glands: Secondary | ICD-10-CM | POA: Diagnosis not present

## 2021-06-22 DIAGNOSIS — H2513 Age-related nuclear cataract, bilateral: Secondary | ICD-10-CM | POA: Diagnosis not present

## 2021-06-22 DIAGNOSIS — G378 Other specified demyelinating diseases of central nervous system: Secondary | ICD-10-CM | POA: Diagnosis not present

## 2021-06-22 DIAGNOSIS — Z79899 Other long term (current) drug therapy: Secondary | ICD-10-CM | POA: Diagnosis not present

## 2021-06-22 DIAGNOSIS — H402231 Chronic angle-closure glaucoma, bilateral, mild stage: Secondary | ICD-10-CM | POA: Diagnosis not present

## 2021-07-05 DIAGNOSIS — G378 Other specified demyelinating diseases of central nervous system: Secondary | ICD-10-CM | POA: Diagnosis not present

## 2021-07-14 DIAGNOSIS — G378 Other specified demyelinating diseases of central nervous system: Secondary | ICD-10-CM | POA: Diagnosis not present

## 2021-07-20 DIAGNOSIS — H469 Unspecified optic neuritis: Secondary | ICD-10-CM | POA: Diagnosis not present

## 2021-07-21 DIAGNOSIS — H469 Unspecified optic neuritis: Secondary | ICD-10-CM | POA: Diagnosis not present

## 2021-07-22 DIAGNOSIS — H469 Unspecified optic neuritis: Secondary | ICD-10-CM | POA: Diagnosis not present

## 2021-08-07 DIAGNOSIS — L910 Hypertrophic scar: Secondary | ICD-10-CM | POA: Diagnosis not present

## 2021-08-07 DIAGNOSIS — L578 Other skin changes due to chronic exposure to nonionizing radiation: Secondary | ICD-10-CM | POA: Diagnosis not present

## 2021-08-07 DIAGNOSIS — L219 Seborrheic dermatitis, unspecified: Secondary | ICD-10-CM | POA: Diagnosis not present

## 2021-08-07 DIAGNOSIS — D225 Melanocytic nevi of trunk: Secondary | ICD-10-CM | POA: Diagnosis not present

## 2021-08-07 DIAGNOSIS — L570 Actinic keratosis: Secondary | ICD-10-CM | POA: Diagnosis not present

## 2021-08-08 DIAGNOSIS — R221 Localized swelling, mass and lump, neck: Secondary | ICD-10-CM | POA: Diagnosis not present

## 2021-08-17 DIAGNOSIS — Y999 Unspecified external cause status: Secondary | ICD-10-CM | POA: Diagnosis not present

## 2021-08-17 DIAGNOSIS — R220 Localized swelling, mass and lump, head: Secondary | ICD-10-CM | POA: Diagnosis not present

## 2021-08-17 DIAGNOSIS — X58XXXA Exposure to other specified factors, initial encounter: Secondary | ICD-10-CM | POA: Diagnosis not present

## 2021-08-17 DIAGNOSIS — T63441A Toxic effect of venom of bees, accidental (unintentional), initial encounter: Secondary | ICD-10-CM | POA: Diagnosis not present

## 2021-08-22 DIAGNOSIS — G4733 Obstructive sleep apnea (adult) (pediatric): Secondary | ICD-10-CM | POA: Diagnosis not present

## 2021-09-03 DIAGNOSIS — G378 Other specified demyelinating diseases of central nervous system: Secondary | ICD-10-CM | POA: Diagnosis not present

## 2021-09-03 DIAGNOSIS — H402231 Chronic angle-closure glaucoma, bilateral, mild stage: Secondary | ICD-10-CM | POA: Diagnosis not present

## 2021-09-03 DIAGNOSIS — H469 Unspecified optic neuritis: Secondary | ICD-10-CM | POA: Diagnosis not present

## 2021-09-03 DIAGNOSIS — H2513 Age-related nuclear cataract, bilateral: Secondary | ICD-10-CM | POA: Diagnosis not present

## 2021-09-17 DIAGNOSIS — Z905 Acquired absence of kidney: Secondary | ICD-10-CM | POA: Diagnosis not present

## 2021-09-17 DIAGNOSIS — N182 Chronic kidney disease, stage 2 (mild): Secondary | ICD-10-CM | POA: Diagnosis not present

## 2021-09-18 DIAGNOSIS — G4733 Obstructive sleep apnea (adult) (pediatric): Secondary | ICD-10-CM | POA: Diagnosis not present

## 2021-10-04 DIAGNOSIS — R5383 Other fatigue: Secondary | ICD-10-CM | POA: Diagnosis not present

## 2021-10-04 DIAGNOSIS — R059 Cough, unspecified: Secondary | ICD-10-CM | POA: Diagnosis not present

## 2021-10-04 DIAGNOSIS — R509 Fever, unspecified: Secondary | ICD-10-CM | POA: Diagnosis not present

## 2021-10-04 DIAGNOSIS — Z03818 Encounter for observation for suspected exposure to other biological agents ruled out: Secondary | ICD-10-CM | POA: Diagnosis not present

## 2021-10-04 DIAGNOSIS — R051 Acute cough: Secondary | ICD-10-CM | POA: Diagnosis not present

## 2021-10-04 DIAGNOSIS — J029 Acute pharyngitis, unspecified: Secondary | ICD-10-CM | POA: Diagnosis not present

## 2021-10-11 DIAGNOSIS — Z905 Acquired absence of kidney: Secondary | ICD-10-CM | POA: Diagnosis not present

## 2021-10-11 DIAGNOSIS — C641 Malignant neoplasm of right kidney, except renal pelvis: Secondary | ICD-10-CM | POA: Diagnosis not present

## 2021-10-25 DIAGNOSIS — G0400 Acute disseminated encephalitis and encephalomyelitis, unspecified: Secondary | ICD-10-CM | POA: Diagnosis not present

## 2021-10-26 DIAGNOSIS — G0400 Acute disseminated encephalitis and encephalomyelitis, unspecified: Secondary | ICD-10-CM | POA: Diagnosis not present

## 2021-11-07 DIAGNOSIS — N400 Enlarged prostate without lower urinary tract symptoms: Secondary | ICD-10-CM | POA: Diagnosis not present

## 2021-11-07 DIAGNOSIS — C641 Malignant neoplasm of right kidney, except renal pelvis: Secondary | ICD-10-CM | POA: Diagnosis not present

## 2021-11-07 DIAGNOSIS — Z905 Acquired absence of kidney: Secondary | ICD-10-CM | POA: Diagnosis not present

## 2021-11-07 DIAGNOSIS — N289 Disorder of kidney and ureter, unspecified: Secondary | ICD-10-CM | POA: Diagnosis not present

## 2021-11-08 DIAGNOSIS — G0400 Acute disseminated encephalitis and encephalomyelitis, unspecified: Secondary | ICD-10-CM | POA: Diagnosis not present

## 2021-11-09 DIAGNOSIS — G0400 Acute disseminated encephalitis and encephalomyelitis, unspecified: Secondary | ICD-10-CM | POA: Diagnosis not present

## 2021-11-22 DIAGNOSIS — H2513 Age-related nuclear cataract, bilateral: Secondary | ICD-10-CM | POA: Diagnosis not present

## 2021-11-22 DIAGNOSIS — G378 Other specified demyelinating diseases of central nervous system: Secondary | ICD-10-CM | POA: Diagnosis not present

## 2021-11-22 DIAGNOSIS — G0400 Acute disseminated encephalitis and encephalomyelitis, unspecified: Secondary | ICD-10-CM | POA: Diagnosis not present

## 2021-11-22 DIAGNOSIS — H40033 Anatomical narrow angle, bilateral: Secondary | ICD-10-CM | POA: Diagnosis not present

## 2021-11-22 DIAGNOSIS — H468 Other optic neuritis: Secondary | ICD-10-CM | POA: Diagnosis not present

## 2021-11-22 DIAGNOSIS — Z79899 Other long term (current) drug therapy: Secondary | ICD-10-CM | POA: Diagnosis not present

## 2021-11-22 DIAGNOSIS — H547 Unspecified visual loss: Secondary | ICD-10-CM | POA: Diagnosis not present

## 2021-11-23 DIAGNOSIS — G0400 Acute disseminated encephalitis and encephalomyelitis, unspecified: Secondary | ICD-10-CM | POA: Diagnosis not present

## 2021-11-26 DIAGNOSIS — G378 Other specified demyelinating diseases of central nervous system: Secondary | ICD-10-CM | POA: Diagnosis not present

## 2021-12-06 DIAGNOSIS — G0400 Acute disseminated encephalitis and encephalomyelitis, unspecified: Secondary | ICD-10-CM | POA: Diagnosis not present

## 2021-12-07 DIAGNOSIS — G0400 Acute disseminated encephalitis and encephalomyelitis, unspecified: Secondary | ICD-10-CM | POA: Diagnosis not present

## 2021-12-20 DIAGNOSIS — G0400 Acute disseminated encephalitis and encephalomyelitis, unspecified: Secondary | ICD-10-CM | POA: Diagnosis not present

## 2021-12-21 DIAGNOSIS — G0400 Acute disseminated encephalitis and encephalomyelitis, unspecified: Secondary | ICD-10-CM | POA: Diagnosis not present

## 2021-12-24 DIAGNOSIS — G4733 Obstructive sleep apnea (adult) (pediatric): Secondary | ICD-10-CM | POA: Diagnosis not present

## 2022-01-03 DIAGNOSIS — G0400 Acute disseminated encephalitis and encephalomyelitis, unspecified: Secondary | ICD-10-CM | POA: Diagnosis not present

## 2022-01-04 DIAGNOSIS — G0400 Acute disseminated encephalitis and encephalomyelitis, unspecified: Secondary | ICD-10-CM | POA: Diagnosis not present

## 2022-01-14 DIAGNOSIS — G378 Other specified demyelinating diseases of central nervous system: Secondary | ICD-10-CM | POA: Diagnosis not present

## 2022-01-17 DIAGNOSIS — G0400 Acute disseminated encephalitis and encephalomyelitis, unspecified: Secondary | ICD-10-CM | POA: Diagnosis not present

## 2022-01-18 DIAGNOSIS — G0400 Acute disseminated encephalitis and encephalomyelitis, unspecified: Secondary | ICD-10-CM | POA: Diagnosis not present

## 2022-01-31 DIAGNOSIS — G0400 Acute disseminated encephalitis and encephalomyelitis, unspecified: Secondary | ICD-10-CM | POA: Diagnosis not present

## 2022-02-01 DIAGNOSIS — G0400 Acute disseminated encephalitis and encephalomyelitis, unspecified: Secondary | ICD-10-CM | POA: Diagnosis not present

## 2022-02-06 DIAGNOSIS — R221 Localized swelling, mass and lump, neck: Secondary | ICD-10-CM | POA: Diagnosis not present

## 2022-02-09 DIAGNOSIS — G0400 Acute disseminated encephalitis and encephalomyelitis, unspecified: Secondary | ICD-10-CM | POA: Diagnosis not present

## 2022-02-23 DIAGNOSIS — G0400 Acute disseminated encephalitis and encephalomyelitis, unspecified: Secondary | ICD-10-CM | POA: Diagnosis not present

## 2023-04-03 ENCOUNTER — Other Ambulatory Visit: Payer: Self-pay | Admitting: Family Medicine

## 2023-04-03 DIAGNOSIS — N50819 Testicular pain, unspecified: Secondary | ICD-10-CM

## 2023-04-08 ENCOUNTER — Ambulatory Visit
Admission: RE | Admit: 2023-04-08 | Discharge: 2023-04-08 | Disposition: A | Discharge status: Home / Self Care | Payer: Medicare Other | Source: Ambulatory Visit | Attending: Family Medicine | Admitting: Family Medicine

## 2023-04-08 DIAGNOSIS — N50819 Testicular pain, unspecified: Secondary | ICD-10-CM
